# Patient Record
Sex: Male | Born: 1945 | Race: White | Hispanic: No | Marital: Married | Smoking: Former smoker
Health system: Southern US, Community
[De-identification: ages and names within clinical notes are randomized; demographics above are authoritative.]

## PROBLEM LIST (undated history)

## (undated) DIAGNOSIS — E7800 Pure hypercholesterolemia, unspecified: Secondary | ICD-10-CM

## (undated) DIAGNOSIS — F4310 Post-traumatic stress disorder, unspecified: Secondary | ICD-10-CM

## (undated) DIAGNOSIS — K219 Gastro-esophageal reflux disease without esophagitis: Secondary | ICD-10-CM

## (undated) DIAGNOSIS — N400 Enlarged prostate without lower urinary tract symptoms: Secondary | ICD-10-CM

## (undated) DIAGNOSIS — M5430 Sciatica, unspecified side: Secondary | ICD-10-CM

## (undated) DIAGNOSIS — M549 Dorsalgia, unspecified: Secondary | ICD-10-CM

## (undated) DIAGNOSIS — M4310 Spondylolisthesis, site unspecified: Secondary | ICD-10-CM

## (undated) DIAGNOSIS — R001 Bradycardia, unspecified: Secondary | ICD-10-CM

## (undated) DIAGNOSIS — I639 Cerebral infarction, unspecified: Secondary | ICD-10-CM

## (undated) HISTORY — DX: Pure hypercholesterolemia, unspecified: E78.00

## (undated) HISTORY — DX: Cerebral infarction, unspecified: I63.9

## (undated) HISTORY — DX: Spondylolisthesis, site unspecified: M43.10

## (undated) HISTORY — PX: NO PAST SURGERIES: SHX2092

---

## 2012-01-01 ENCOUNTER — Encounter (HOSPITAL_BASED_OUTPATIENT_CLINIC_OR_DEPARTMENT_OTHER): Payer: Self-pay | Admitting: Family Medicine

## 2012-01-01 ENCOUNTER — Emergency Department (HOSPITAL_BASED_OUTPATIENT_CLINIC_OR_DEPARTMENT_OTHER): Payer: Medicare Other

## 2012-01-01 ENCOUNTER — Emergency Department (HOSPITAL_BASED_OUTPATIENT_CLINIC_OR_DEPARTMENT_OTHER)
Admission: EM | Admit: 2012-01-01 | Discharge: 2012-01-01 | Disposition: A | Discharge status: Home / Self Care | Payer: Medicare Other | Attending: Emergency Medicine | Admitting: Emergency Medicine

## 2012-01-01 DIAGNOSIS — M7072 Other bursitis of hip, left hip: Secondary | ICD-10-CM

## 2012-01-01 DIAGNOSIS — M76899 Other specified enthesopathies of unspecified lower limb, excluding foot: Secondary | ICD-10-CM | POA: Insufficient documentation

## 2012-01-01 HISTORY — DX: Gastro-esophageal reflux disease without esophagitis: K21.9

## 2012-01-01 MED ORDER — OXYCODONE-ACETAMINOPHEN 5-325 MG PO TABS: 2.0000 | ORAL_TABLET | ORAL | Status: DC | PRN

## 2012-01-01 MED ORDER — TRAMADOL HCL 50 MG PO TABS: 50.0000 mg | ORAL_TABLET | Freq: Four times a day (QID) | ORAL | Status: DC | PRN

## 2012-01-01 NOTE — ED Provider Notes (Signed)
History     CSN: [id number]  Arrival date & time 01/01/12  1020   First MD Initiated Contact with Patient 01/01/12 1215      Chief Complaint  Patient presents with  . Hip Pain    (Consider location/radiation/quality/duration/timing/severity/associated sxs/prior treatment) HPI Comments: Patient is a 66 year old male who presents with left hip pain for the past week. The pain started gradually and is now a constant aching pain. The pain starts in his left hip and radiates to left knee. He reports having this pain in the past for which he received a cortisone injection. He reports having a negative xray 2 days ago at his doctor's office. Patient reports the pain affecting his ability to walk today. Some of the pain has been relieved with hydrocodone at home. Weight bearing activity and left leg movement make the pain worse. Nothing makes the pain better. Patient denies recent injury, NVD fever, numbness/tingling, weakness/coolness/color change of extremity, abdominal pain, chest pain, SOB.    Past Medical History  Diagnosis Date  . GERD (gastroesophageal reflux disease)     History reviewed. No pertinent past surgical history.  No family history on file.  History  Substance Use Topics  . Smoking status: Never Smoker   . Smokeless tobacco: Not on file  . Alcohol Use: No      Review of Systems  Musculoskeletal: Positive for arthralgias.  All other systems reviewed and are negative.    Allergies  Review of patient's allergies indicates no known allergies.  Home Medications  No current outpatient prescriptions on file.  BP 148/80  Pulse 55  Temp 97.7 F (36.5 C) (Oral)  Resp 16  Ht 5\' 10"  (1.778 m)  Wt 196 lb (88.905 kg)  BMI 28.12 kg/m2  SpO2 100%  Physical Exam  Nursing note and vitals reviewed. Constitutional: He is oriented to person, place, and time. He appears well-developed and well-nourished. No distress.  HENT:  Head: Normocephalic and atraumatic.    Eyes: Conjunctivae normal and EOM are normal.  Neck: Normal range of motion. Neck supple.  Cardiovascular: Normal rate and regular rhythm.  Exam reveals no gallop and no friction rub.   No murmur heard. Pulmonary/Chest: Effort normal and breath sounds normal. He has no wheezes. He has no rales. He exhibits no tenderness.  Abdominal: Soft. There is no tenderness.  Musculoskeletal: Normal range of motion.       Tenderness to palpation of posterior and lateral left hip. No edema noted. ROM of left hip slightly limited due to pain. Full ROM of right hip. Left knee has full ROM as well as right knee. No edema noted of knees.   Neurological: He is alert and oriented to person, place, and time. Coordination normal.       Sensation equal bilaterally. Strength diminished of left lower extremity due to pain with hip movement. Speech is goal-oriented. Moves limbs without ataxia.   Skin: Skin is warm and dry. He is not diaphoretic.       No bruising or color change noted.   Psychiatric: He has a normal mood and affect. His behavior is normal.    ED Course  Procedures (including critical care time)  Labs Reviewed - No data to display Dg Hip Complete Left  01/01/2012  *RADIOLOGY REPORT*  Clinical Data: Left hip pain  LEFT HIP - COMPLETE 2+ VIEW  Comparison: None.  Findings: Frontal pelvis shows no fracture.  SI joints and symphysis pubis are normal.  Joint space in  the hips is well preserved and symmetric.  AP and frog-leg lateral views of the left upper normal.  IMPRESSION: No acute bony findings.   Original Report Authenticated By: ERIC A. MANSELL, M.D.      1. Bursitis of left hip       MDM  12:35 PM Patient will have left hip xray to evaluate for degenerative changes and rule out fracture. Patient likely will be referred to orthopedics. He denies needing pain medication at this time.   2:00 PM Hip xray negative for fracture or degenerative changes. Patient likely has hip bursitis. Patient  will follow up with Orthopedic surgeon for further evaluation as needed. Prescribed Ultram and Percocet to be used for pain control separately. Patient is agreeable to plan. No saddle paresthesias, bladder/bowel incontinence.       Emilia Beck, PA-C 01/11/12 1002

## 2012-01-01 NOTE — ED Notes (Signed)
Pt sts he thinks he injured left hip about a wk ago and since has been having pain to left hip radiating down left leg and knee. Pt sts he had negative XR 2 days ago. Pt has had injection to same hip for pain in recent past. Pt took 2 hydrocodone prior to arrival. Pt sts he couldn't walk prior to taking the pain med.

## 2012-01-01 NOTE — ED Provider Notes (Signed)
66 year old male injured his left hip 3 weeks ago. 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 He's been having pain in his hip since then. He saw his PCP who gave him a steroid injection which gave temporary relief. He got worse after [place] On exam, he has had tenderness to palpation in the left sacroiliac area and pain is reproduced by external rotation of the hip. There's negative straight leg raise. His x-rays were reviewed by myself and appeared completely normal. He appears to have a bursitis of his hip and will be treated symptomatically. He is referred back to his PCP to consider MRI scanning if he does not show any improvement.  Medical screening examination/treatment/procedure(s) were conducted as a shared visit with non-physician practitioner(s) and myself.  I personally evaluated the patient during the encounter   Dione Booze, MD 01/01/12 1353

## 2018-03-25 ENCOUNTER — Encounter (HOSPITAL_BASED_OUTPATIENT_CLINIC_OR_DEPARTMENT_OTHER): Payer: Self-pay

## 2018-03-25 ENCOUNTER — Observation Stay (HOSPITAL_BASED_OUTPATIENT_CLINIC_OR_DEPARTMENT_OTHER)
Admission: EM | Admit: 2018-03-25 | Discharge: 2018-03-26 | Disposition: A | Discharge status: Home / Self Care | Payer: Medicare Other | Attending: Internal Medicine | Admitting: Internal Medicine

## 2018-03-25 ENCOUNTER — Other Ambulatory Visit: Payer: Self-pay

## 2018-03-25 ENCOUNTER — Emergency Department (HOSPITAL_BASED_OUTPATIENT_CLINIC_OR_DEPARTMENT_OTHER): Payer: Medicare Other

## 2018-03-25 DIAGNOSIS — I639 Cerebral infarction, unspecified: Secondary | ICD-10-CM | POA: Diagnosis not present

## 2018-03-25 DIAGNOSIS — R471 Dysarthria and anarthria: Secondary | ICD-10-CM | POA: Insufficient documentation

## 2018-03-25 DIAGNOSIS — R2681 Unsteadiness on feet: Secondary | ICD-10-CM | POA: Diagnosis not present

## 2018-03-25 DIAGNOSIS — B9689 Other specified bacterial agents as the cause of diseases classified elsewhere: Secondary | ICD-10-CM | POA: Diagnosis not present

## 2018-03-25 DIAGNOSIS — R9431 Abnormal electrocardiogram [ECG] [EKG]: Secondary | ICD-10-CM | POA: Diagnosis not present

## 2018-03-25 DIAGNOSIS — I340 Nonrheumatic mitral (valve) insufficiency: Secondary | ICD-10-CM | POA: Diagnosis not present

## 2018-03-25 DIAGNOSIS — R2689 Other abnormalities of gait and mobility: Secondary | ICD-10-CM | POA: Diagnosis not present

## 2018-03-25 DIAGNOSIS — Z87891 Personal history of nicotine dependence: Secondary | ICD-10-CM | POA: Insufficient documentation

## 2018-03-25 DIAGNOSIS — K219 Gastro-esophageal reflux disease without esophagitis: Secondary | ICD-10-CM | POA: Diagnosis present

## 2018-03-25 DIAGNOSIS — Z79899 Other long term (current) drug therapy: Secondary | ICD-10-CM | POA: Insufficient documentation

## 2018-03-25 DIAGNOSIS — Z8249 Family history of ischemic heart disease and other diseases of the circulatory system: Secondary | ICD-10-CM | POA: Diagnosis not present

## 2018-03-25 DIAGNOSIS — G459 Transient cerebral ischemic attack, unspecified: Secondary | ICD-10-CM | POA: Diagnosis present

## 2018-03-25 DIAGNOSIS — M5440 Lumbago with sciatica, unspecified side: Secondary | ICD-10-CM | POA: Insufficient documentation

## 2018-03-25 DIAGNOSIS — M549 Dorsalgia, unspecified: Secondary | ICD-10-CM | POA: Diagnosis present

## 2018-03-25 DIAGNOSIS — J320 Chronic maxillary sinusitis: Secondary | ICD-10-CM | POA: Diagnosis not present

## 2018-03-25 DIAGNOSIS — I517 Cardiomegaly: Secondary | ICD-10-CM | POA: Insufficient documentation

## 2018-03-25 DIAGNOSIS — E785 Hyperlipidemia, unspecified: Secondary | ICD-10-CM | POA: Insufficient documentation

## 2018-03-25 HISTORY — DX: Dorsalgia, unspecified: M54.9

## 2018-03-25 HISTORY — DX: Sciatica, unspecified side: M54.30

## 2018-03-25 LAB — RAPID URINE DRUG SCREEN, HOSP PERFORMED
Amphetamines: NOT DETECTED
BARBITURATES: NOT DETECTED
BENZODIAZEPINES: NOT DETECTED
COCAINE: NOT DETECTED
Opiates: NOT DETECTED
Tetrahydrocannabinol: NOT DETECTED

## 2018-03-25 LAB — CBC
HCT: 44.6 % (ref 39.0–52.0)
Hemoglobin: 14.7 g/dL (ref 13.0–17.0)
MCH: 29.6 pg (ref 26.0–34.0)
MCHC: 33 g/dL (ref 30.0–36.0)
MCV: 89.9 fL (ref 80.0–100.0)
NRBC: 0 % (ref 0.0–0.2)
PLATELETS: 162 10*3/uL (ref 150–400)
RBC: 4.96 MIL/uL (ref 4.22–5.81)
RDW: 11.9 % (ref 11.5–15.5)
WBC: 8.8 10*3/uL (ref 4.0–10.5)

## 2018-03-25 LAB — COMPREHENSIVE METABOLIC PANEL
ALBUMIN: 4.4 g/dL (ref 3.5–5.0)
ALK PHOS: 91 U/L (ref 38–126)
ALT: 18 U/L (ref 0–44)
ANION GAP: 7 (ref 5–15)
AST: 24 U/L (ref 15–41)
BILIRUBIN TOTAL: 0.7 mg/dL (ref 0.3–1.2)
BUN: 13 mg/dL (ref 8–23)
CALCIUM: 9.5 mg/dL (ref 8.9–10.3)
CO2: 27 mmol/L (ref 22–32)
Chloride: 103 mmol/L (ref 98–111)
Creatinine, Ser: 0.88 mg/dL (ref 0.61–1.24)
GFR calc Af Amer: >60 mL/min (ref >60)
GFR calc non Af Amer: >60 mL/min (ref >60)
GLUCOSE: 95 mg/dL (ref 70–99)
Potassium: 3.8 mmol/L (ref 3.5–5.1)
Sodium: 137 mmol/L (ref 135–145)
TOTAL PROTEIN: 7 g/dL (ref 6.5–8.1)

## 2018-03-25 LAB — URINALYSIS, ROUTINE W REFLEX MICROSCOPIC
Bilirubin Urine: NEGATIVE
GLUCOSE, UA: NEGATIVE mg/dL
Hgb urine dipstick: NEGATIVE
Ketones, ur: NEGATIVE mg/dL
LEUKOCYTES UA: NEGATIVE
Nitrite: NEGATIVE
PH: 6.5 (ref 5.0–8.0)
Protein, ur: NEGATIVE mg/dL
Specific Gravity, Urine: <1.005 — ABNORMAL LOW (ref 1.005–1.030)

## 2018-03-25 LAB — DIFFERENTIAL
Abs Immature Granulocytes: 0.03 10*3/uL (ref 0.00–0.07)
BASOS ABS: 0.1 10*3/uL (ref 0.0–0.1)
Basophils Relative: 1 %
EOS ABS: 0.4 10*3/uL (ref 0.0–0.5)
EOS PCT: 4 %
IMMATURE GRANULOCYTES: 0 %
LYMPHS PCT: 28 %
Lymphs Abs: 2.5 10*3/uL (ref 0.7–4.0)
MONO ABS: 0.8 10*3/uL (ref 0.1–1.0)
Monocytes Relative: 9 %
NEUTROS PCT: 58 %
Neutro Abs: 5.1 10*3/uL (ref 1.7–7.7)

## 2018-03-25 LAB — APTT: APTT: 31 s (ref 24–36)

## 2018-03-25 LAB — PROTIME-INR
INR: 0.95
PROTHROMBIN TIME: 12.6 s (ref 11.4–15.2)

## 2018-03-25 LAB — ETHANOL: Alcohol, Ethyl (B): <10 mg/dL (ref <10)

## 2018-03-25 LAB — CBG MONITORING, ED: Glucose-Capillary: 90 mg/dL (ref 70–99)

## 2018-03-25 LAB — TROPONIN I: Troponin I: <0.03 ng/mL (ref <0.03)

## 2018-03-25 NOTE — ED Provider Notes (Signed)
[facility] EMERGENCY DEPARTMENT Provider Note   CSN: [id number] Arrival date & time: 03/25/18  1944   History   Chief Complaint Chief Complaint  Patient presents with  . Aphasia    HPI Isaac Hicks is a 73 y.o. male with PMH of GERD and sciatica here after having slurring of speech earlier today.  Patient reports that he was on the phone with his son around 2 PM and around 45 called his grandson, who he thought was his son and found his tongue very heavy and unable to speak words.  His grandson reports that he was slurring his speech and he was unable to understand what he was saying.  Patient also reports that he had a sudden onset headache on his left frontal aspect of his head.  He also at the time felt as if he was unable to walk.  He states that it felt as if he was unsteady and would fall if he tried to walk.  Patient did not note any certain weakness in any one leg or extremity.  His grandson who was 5 minutes away immediately came to pick him up.  His grandson notes that around 77 patient symptoms have completely resolved.  Patient denies any history of stroke.  Reports that he is otherwise relatively healthy.  He is also very active.  Denies any recent illnesses, nausea, vomiting.   Upon our exam in the emergency room patient with no slurring of speech.  Also with no weakness.   HPI  Past Medical History:  Diagnosis Date  . GERD (gastroesophageal reflux disease)   . Sciatica     There are no active problems to display for this patient.   History reviewed. No pertinent surgical history.     Home Medications    Prior to Admission medications   Medication Sig Start Date End Date Taking? Authorizing Provider  oxyCODONE-acetaminophen (PERCOCET/ROXICET) 5-325 MG per tablet Take 2 tablets by mouth every 4 (four) hours as needed for pain. 01/01/12   Szekalski, Kaitlyn, PA-C  traMADol (ULTRAM) 50 MG tablet Take 1 tablet (50 mg total) by mouth every 6 (six)  hours as needed for pain. 01/01/12   Emilia Beck, PA-C    Family History No family history on file.  Social History Social History   Tobacco Use  . Smoking status: Former Games developer  . Smokeless tobacco: Never Used  Substance Use Topics  . Alcohol use: No  . Drug use: No     Allergies   Patient has no known allergies.   Review of Systems Review of Systems  Constitutional: Negative for chills and fever.  Eyes: Negative for visual disturbance.  Respiratory: Negative for shortness of breath.   Cardiovascular: Negative for chest pain.  Gastrointestinal: Negative for nausea and vomiting.  Neurological: Positive for speech difficulty, weakness and headaches. Negative for light-headedness.  All other systems reviewed and are negative.    Physical Exam Updated Vital Signs BP (!) 142/92   Pulse 74   Temp 98.3 F (36.8 C) (Oral)   Resp (!) 21   Ht 5\' 9"  (1.753 m)   Wt 78 kg   SpO2 98%   BMI 25.40 kg/m   Physical Exam Vitals signs and nursing note reviewed.  Constitutional:      General: He is not in acute distress.    Appearance: Normal appearance.  HENT:     Head: Normocephalic and atraumatic.     Nose: Nose normal.  Eyes:     Extraocular  Movements: Extraocular movements intact.     Conjunctiva/sclera: Conjunctivae normal.     Pupils: Pupils are equal, round, and reactive to light.  Neck:     Musculoskeletal: Normal range of motion and neck supple.  Pulmonary:     Effort: Pulmonary effort is normal.  Musculoskeletal: Normal range of motion.  Skin:    General: Skin is warm.  Neurological:     General: No focal deficit present.     Mental Status: He is alert and oriented to person, place, and time. Mental status is at baseline.     Cranial Nerves: No cranial nerve deficit.     Motor: No weakness.     Coordination: Coordination normal.  Psychiatric:        Mood and Affect: Mood normal.        Behavior: Behavior normal.        Thought Content: Thought  content normal.      ED Treatments / Results  Labs (all labs ordered are listed, but only abnormal results are displayed) Labs Reviewed  URINALYSIS, ROUTINE W REFLEX MICROSCOPIC - Abnormal; Notable for the following components:      Result Value   Specific Gravity, Urine <1.005 (*)    All other components within normal limits  ETHANOL  PROTIME-INR  APTT  CBC  DIFFERENTIAL  COMPREHENSIVE METABOLIC PANEL  TROPONIN I  RAPID URINE DRUG SCREEN, HOSP PERFORMED  CBG MONITORING, ED    EKG EKG Interpretation  Date/Time:  Wednesday March 25 2018 19:55:50 EST Ventricular Rate:  67 PR Interval:    QRS Duration: 97 QT Interval:  418 QTC Calculation: 442 R Axis:   -39 Text Interpretation:  Sinus rhythm Left axis deviation Abnormal R-wave progression, late transition no prior available for comparison Confirmed by Tilden Fossaees, Elizabeth [phone]([id number]) on 03/25/2018 7:59:54 PM   Radiology Ct Head Wo Contrast  Result Date: 03/25/2018 CLINICAL DATA:  Dysarthria and gait difficulties, now back to baseline. No reported injury. EXAM: CT HEAD WITHOUT CONTRAST TECHNIQUE: Contiguous axial images were obtained from the base of the skull through the vertex without intravenous contrast. COMPARISON:  None. FINDINGS: Brain: No evidence of parenchymal hemorrhage or extra-axial fluid collection. No mass lesion, mass effect, or midline shift. No CT evidence of acute infarction. Nonspecific mild subcortical and periventricular white matter hypodensity, most in keeping with chronic small vessel ischemic change. Cerebral volume is age appropriate. No ventriculomegaly. Vascular: No acute abnormality. Skull: No evidence of calvarial fracture. Sinuses/Orbits: No fluid levels. Mucoperiosteal thickening throughout the left maxillary sinus. Other:  The mastoid air cells are unopacified. IMPRESSION: 1. No evidence of acute intracranial abnormality. 2. 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 3. Chronic  appearing left maxillary sinusitis. Electronically Signed   By: Delbert PhenixJason A Poff M.D.   On: 03/25/2018 20:20    Procedures Procedures (including critical care time)  Medications Ordered in ED Medications - No data to display   Initial Impression / Assessment and Plan / ED Course  I have reviewed the triage vital signs and the nursing notes.  Pertinent labs & imaging results that were available during my care of the patient were reviewed by me and considered in my medical decision making (see chart for details).    73 year old male who presents with symptoms of TIA.  Patient notes that he was having trouble with slurring of speech.  He was also having trouble with his gait due to feeling unsteady.  Will perform stroke work-up with CT head without, UA,  CMP, CBC, troponin, ethanol level, UDS.  Dr. Madilyn Hook spoke with neurology over the phone regarding need for other work-up and states that as patient symptoms have resolved we can do traditional stroke work-up.  Patient's lab work grossly normal with CT head without not showing any acute bleed.  Patient to be admitted to hospitalist at [facility] for TIA work-up.   [place] Tabria Steines, DO PGY-2, [facility]   Final Clinical Impressions(s) / ED Diagnoses   Final diagnoses:  TIA (transient ischemic attack)    ED Discharge Orders    None       Zasha Belleau, [place], DO 03/25/18 2120    Tilden Fossa, MD 03/26/18 0003

## 2018-03-25 NOTE — ED Notes (Signed)
Pt currently in CT.

## 2018-03-25 NOTE — ED Notes (Signed)
Pt returned from CT, on cardiac monitor and family at the bedside

## 2018-03-25 NOTE — ED Notes (Signed)
Pt states that during the episode of slurred speech, he had a headache, and now the headache has subsided

## 2018-03-25 NOTE — ED Triage Notes (Signed)
Pt states he was on the phone with grandson ~705pm when his speech slurred and he felt he could not walk-son went to his home and brought him in-also c/o HA-pt was able to stand from chair to w/c to stretcher-EDP in room upon arrival

## 2018-03-25 NOTE — ED Notes (Signed)
Phoned pt's wife and updated her to pt's admission status

## 2018-03-26 ENCOUNTER — Observation Stay (HOSPITAL_BASED_OUTPATIENT_CLINIC_OR_DEPARTMENT_OTHER): Payer: Medicare Other

## 2018-03-26 ENCOUNTER — Observation Stay (HOSPITAL_COMMUNITY): Payer: Medicare Other

## 2018-03-26 ENCOUNTER — Encounter (HOSPITAL_COMMUNITY): Payer: Self-pay | Admitting: Internal Medicine

## 2018-03-26 DIAGNOSIS — M549 Dorsalgia, unspecified: Secondary | ICD-10-CM | POA: Diagnosis present

## 2018-03-26 DIAGNOSIS — I639 Cerebral infarction, unspecified: Secondary | ICD-10-CM | POA: Diagnosis not present

## 2018-03-26 DIAGNOSIS — K219 Gastro-esophageal reflux disease without esophagitis: Secondary | ICD-10-CM | POA: Diagnosis not present

## 2018-03-26 DIAGNOSIS — E785 Hyperlipidemia, unspecified: Secondary | ICD-10-CM | POA: Diagnosis not present

## 2018-03-26 DIAGNOSIS — I340 Nonrheumatic mitral (valve) insufficiency: Secondary | ICD-10-CM

## 2018-03-26 DIAGNOSIS — G459 Transient cerebral ischemic attack, unspecified: Secondary | ICD-10-CM

## 2018-03-26 DIAGNOSIS — I370 Nonrheumatic pulmonary valve stenosis: Secondary | ICD-10-CM

## 2018-03-26 DIAGNOSIS — I361 Nonrheumatic tricuspid (valve) insufficiency: Secondary | ICD-10-CM

## 2018-03-26 LAB — LIPID PANEL
Cholesterol: 189 mg/dL (ref 0–200)
HDL: 37 mg/dL — ABNORMAL LOW (ref >40)
LDL Cholesterol: 132 mg/dL — ABNORMAL HIGH (ref 0–99)
TRIGLYCERIDES: 102 mg/dL (ref <150)
Total CHOL/HDL Ratio: 5.1 RATIO
VLDL: 20 mg/dL (ref 0–40)

## 2018-03-26 LAB — HEMOGLOBIN A1C
Hgb A1c MFr Bld: 5.2 % (ref 4.8–5.6)
Mean Plasma Glucose: 102.54 mg/dL

## 2018-03-26 LAB — ECHOCARDIOGRAM COMPLETE
Height: 69 in
Weight: 2752 oz

## 2018-03-26 MED ORDER — ONDANSETRON HCL 4 MG/2ML IJ SOLN: 4.0000 mg | Freq: Three times a day (TID) | INTRAMUSCULAR | Status: DC | PRN

## 2018-03-26 MED ORDER — GABAPENTIN 100 MG PO CAPS: 100.0000 mg | ORAL_CAPSULE | Freq: Two times a day (BID) | ORAL | Status: DC | PRN

## 2018-03-26 MED ORDER — STROKE: EARLY STAGES OF RECOVERY BOOK
Freq: Once | Status: DC
  Filled 2018-03-26: qty 1

## 2018-03-26 MED ORDER — PANTOPRAZOLE SODIUM 40 MG PO TBEC: 40.0000 mg | DELAYED_RELEASE_TABLET | Freq: Every day | ORAL | Status: DC | PRN

## 2018-03-26 MED ORDER — ASPIRIN EC 81 MG PO TBEC
81.0000 mg | DELAYED_RELEASE_TABLET | Freq: Every day | ORAL | Status: DC
  Administered 2018-03-26: 81 mg via ORAL
  Filled 2018-03-26: qty 1

## 2018-03-26 MED ORDER — ACETAMINOPHEN 325 MG PO TABS: 650.0000 mg | ORAL_TABLET | ORAL | Status: DC | PRN

## 2018-03-26 MED ORDER — CLOPIDOGREL BISULFATE 75 MG PO TABS
75.0000 mg | ORAL_TABLET | Freq: Every day | ORAL | Status: DC
  Administered 2018-03-26: 75 mg via ORAL
  Filled 2018-03-26: qty 1

## 2018-03-26 MED ORDER — ACETAMINOPHEN 160 MG/5ML PO SOLN: 650.0000 mg | ORAL | Status: DC | PRN

## 2018-03-26 MED ORDER — ATORVASTATIN CALCIUM 40 MG PO TABS: 40.0000 mg | ORAL_TABLET | Freq: Every day | ORAL | 0 refills | Status: DC

## 2018-03-26 MED ORDER — CLOPIDOGREL BISULFATE 75 MG PO TABS: 75.0000 mg | ORAL_TABLET | Freq: Every day | ORAL | 0 refills | Status: DC

## 2018-03-26 MED ORDER — ASPIRIN 81 MG PO TBEC: 81.0000 mg | DELAYED_RELEASE_TABLET | Freq: Every day | ORAL | 0 refills | Status: DC

## 2018-03-26 MED ORDER — ENOXAPARIN SODIUM 40 MG/0.4ML ~~LOC~~ SOLN
40.0000 mg | SUBCUTANEOUS | Status: DC
  Administered 2018-03-26: 40 mg via SUBCUTANEOUS
  Filled 2018-03-26: qty 0.4

## 2018-03-26 MED ORDER — ASPIRIN 300 MG RE SUPP: 300.0000 mg | Freq: Every day | RECTAL | Status: DC

## 2018-03-26 MED ORDER — OXYCODONE-ACETAMINOPHEN 5-325 MG PO TABS: 2.0000 | ORAL_TABLET | ORAL | Status: DC | PRN

## 2018-03-26 MED ORDER — ATORVASTATIN CALCIUM 40 MG PO TABS
40.0000 mg | ORAL_TABLET | Freq: Every day | ORAL | Status: DC
  Administered 2018-03-26: 40 mg via ORAL
  Filled 2018-03-26: qty 1

## 2018-03-26 MED ORDER — ACETAMINOPHEN 650 MG RE SUPP: 650.0000 mg | RECTAL | Status: DC | PRN

## 2018-03-26 MED ORDER — ASPIRIN 325 MG PO TABS: 325.0000 mg | ORAL_TABLET | Freq: Every day | ORAL | Status: DC

## 2018-03-26 MED ORDER — SENNOSIDES-DOCUSATE SODIUM 8.6-50 MG PO TABS: 1.0000 | ORAL_TABLET | Freq: Every evening | ORAL | Status: DC | PRN

## 2018-03-26 MED ORDER — ZOLPIDEM TARTRATE 5 MG PO TABS: 5.0000 mg | ORAL_TABLET | Freq: Every evening | ORAL | Status: DC | PRN

## 2018-03-26 NOTE — Progress Notes (Signed)
VASCULAR LAB PRELIMINARY  PRELIMINARY  PRELIMINARY  PRELIMINARY  Carotid duplex completed.    Preliminary report:  1-39% ICA plaquing.  Vertebral artery flow is antegrade.   Neithan Day, RVT 03/26/2018, 8:30 AM

## 2018-03-26 NOTE — Evaluation (Signed)
Physical Therapy Evaluation Patient Details Name: Isaac CastleFredrick Hicks MRN: [id number] DOB: 24-Jan-1946 Today's Date: 03/26/2018   History of Present Illness  Pt is a 73 y.o. male with medical history significant of cardiac, chronic back pain, who presents with difficulty speaking, slurred speech, gait instability. 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    Clinical Impression  Pt presented sitting OOB in recliner chair, awake and willing to participate in therapy session. Pt's spouse and son present throughout as well. Prior to admission, pt reported that he was independent with all functional mobility and ADLs. Pt lives with his wife in a single level home with a level entry. He currently requires min guard overall for transfers and ambulation without use of an AD. Pt would benefit from f/u PT in the OP setting for higher level balance and strengthening. Pt would continue to benefit from skilled physical therapy services at this time while admitted and after d/c to address the below listed limitations in order to improve overall safety and independence with functional mobility.     Follow Up Recommendations Outpatient PT    Equipment Recommendations  None recommended by PT    Recommendations for Other Services       Precautions / Restrictions Precautions Precautions: Fall Restrictions Weight Bearing Restrictions: No      Mobility  Bed Mobility               General bed mobility comments: pt OOB in recliner chair upon arrival  Transfers Overall transfer level: Needs assistance Equipment used: None Transfers: Sit to/from Stand Sit to Stand: Min guard         General transfer comment: close min guard for safety  Ambulation/Gait Ambulation/Gait assistance: Min guard Gait Distance (Feet): 200 Feet Assistive device: None Gait Pattern/deviations: Step-through pattern;Drifts right/left Gait velocity: decreased   General Gait Details:  donned shoes prior to ambulation; pt with mild instability but no overt LOB or need for physical assistance, min guard for safety  Stairs            Wheelchair Mobility    Modified Rankin (Stroke Patients Only) Modified Rankin (Stroke Patients Only) Pre-Morbid Rankin Score: No symptoms Modified Rankin: Slight disability     Balance Overall balance assessment: Needs assistance Sitting-balance support: Feet supported Sitting balance-Leahy Scale: Good     Standing balance support: No upper extremity supported Standing balance-Leahy Scale: Fair                               Pertinent Vitals/Pain Pain Assessment: No/denies pain    Home Living Family/patient expects to be discharged to:: Private residence Living Arrangements: Spouse/significant other Available Help at Discharge: Family Type of Home: House Home Access: Level entry     Home Layout: One level Home Equipment: Gilmer Morane - single point      Prior Function Level of Independence: Independent               Hand Dominance        Extremity/Trunk Assessment   Upper Extremity Assessment Upper Extremity Assessment: Defer to OT evaluation;Overall WFL for tasks assessed    Lower Extremity Assessment Lower Extremity Assessment: LLE deficits/detail LLE Deficits / Details: hx of sciatica 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    Cervical / Trunk Assessment Cervical / Trunk Assessment: Kyphotic  Communication   Communication: No difficulties  Cognition Arousal/Alertness: Awake/alert Behavior During Therapy: WFL for tasks  assessed/performed Overall Cognitive Status: Within Functional Limits for tasks assessed                                        General Comments      Exercises     Assessment/Plan    PT Assessment Patient needs continued PT services  PT Problem List Decreased strength;Decreased balance;Decreased mobility;Decreased coordination;Decreased knowledge of  use of DME;Decreased safety awareness;Decreased knowledge of precautions       PT Treatment Interventions DME instruction;Gait training;Stair training;Functional mobility training;Therapeutic activities;Therapeutic exercise;Balance training;Neuromuscular re-education;Patient/family education    PT Goals (Current goals can be found in the Care Plan section)  Acute Rehab PT Goals Patient Stated Goal: return home today PT Goal Formulation: With patient/family Time For Goal Achievement: 04/09/18 Potential to Achieve Goals: Good    Frequency Min 4X/week   Barriers to discharge        Co-evaluation               AM-PAC PT "6 Clicks" Mobility  Outcome Measure Help needed turning from your back to your side while in a flat bed without using bedrails?: None Help needed moving from lying on your back to sitting on the side of a flat bed without using bedrails?: None Help needed moving to and from a bed to a chair (including a wheelchair)?: None Help needed standing up from a chair using your arms (e.g., wheelchair or bedside chair)?: None Help needed to walk in hospital room?: None Help needed climbing 3-5 steps with a railing? : A Little 6 Click Score: 23    End of Session Equipment Utilized During Treatment: Gait belt Activity Tolerance: Patient tolerated treatment well Patient left: in chair;with call bell/phone within reach;with family/visitor present Nurse Communication: Mobility status PT Visit Diagnosis: Other abnormalities of gait and mobility (R26.89)    Time: 2536-6440 PT Time Calculation (min) (ACUTE ONLY): 19 min   Charges:   PT Evaluation $PT Eval Moderate Complexity: 1 Mod          Deborah Chalk, PT, DPT  Acute Rehabilitation Services Pager [phone] Office [phone]    Alessandra Bevels Nicholas Ossa 03/26/2018, 2:14 PM

## 2018-03-26 NOTE — Consult Note (Addendum)
Stroke Neurology Consultation Note  Consult Requested by: Dr. Jerral Ralph  Reason for Consult: stroke  Consult Date: 03/26/18  The history was obtained from the pt and wife.  During history and examination, all items were able to obtain unless otherwise noted.  History of Present Illness:  Isaac Hicks is a 73 y.o. Caucasian male with PMH of LBP and sciatica, HLD admitted for transient slurred speech and imbalance.  Pt was talking to his grandson over the phone last night at 7pm and was found to have slurry speech and not able to be understood. When he got up and he felt right leg lack of control and imbalanced on walking. The episode lasted about 5 min and resolved. Shortly after he had headache at right frontal and lasted less than one hour and resolved also. He was sent to ER for evaluation. CT brain negative. Neurology was consulted.   Patient denies any significant past medical history.  He had lower back pain and sciatica, denies any high blood pressure, diabetes.  But seems he does have hyperlipidemia but not on any medication.  LSN: 7pm 03/25/18 tPA Given: No: symptoms resolved  Past Medical History:  Diagnosis Date  . Back pain   . GERD (gastroesophageal reflux disease)   . Sciatica     History reviewed. No pertinent surgical history.  Family History  Problem Relation Age of Onset  . Heart disease Brother        CAD    Social History:  reports that he has quit smoking. He has never used smokeless tobacco. He reports that he does not drink alcohol or use drugs.  Allergies: No Known Allergies  No current facility-administered medications on file prior to encounter.    Current Outpatient Medications on File Prior to Encounter  Medication Sig Dispense Refill  . gabapentin (NEURONTIN) 100 MG capsule Take 100 mg by mouth 2 (two) times daily.    . Multiple Vitamin (MULTIVITAMIN WITH MINERALS) TABS tablet Take 1 tablet by mouth daily.    . saw palmetto 160 MG capsule Take  160 mg by mouth 2 (two) times daily.      Review of Systems: A full ROS was attempted today and was able to be performed.  Systems assessed include - Constitutional, Eyes, HENT, Respiratory, Cardiovascular, Gastrointestinal, Genitourinary, Integument/breast, Hematologic/lymphatic, Musculoskeletal, Neurological, Behavioral/Psych, Endocrine, Allergic/Immunologic - with pertinent responses as per HPI.  Physical Examination: Temp:  [97.8 F (36.6 C)-98.3 F (36.8 C)] 97.8 F (36.6 C) (01/09 0619) Pulse Rate:  [59-91] 64 (01/09 0956) Resp:  [13-22] 18 (01/09 0619) BP: (101-151)/(70-94) 139/83 (01/09 0956) SpO2:  [95 %-100 %] 100 % (01/09 0956) Weight:  [78 kg] 78 kg (01/08 1955)  General - well nourished, well developed, in no apparent distress.    Ophthalmologic - fundi not visualized due to noncooperation.    Cardiovascular - regular rate and rhythm  Mental Status -  Level of arousal and orientation to time, place, and person were intact. Language including expression, naming, repetition, comprehension, reading, and writing was assessed and found intact. Fund of Knowledge was assessed and was intact.  Cranial Nerves II - XII - II - Vision intact OU. III, IV, VI - Extraocular movements intact. V - Facial sensation intact bilaterally. VII - Facial movement intact bilaterally. VIII - Hearing & vestibular intact bilaterally. X - Palate elevates symmetrically. XI - Chin turning & shoulder shrug intact bilaterally. XII - Tongue protrusion intact.  Motor Strength - The patient's strength was normal in all extremities  and pronator drift was absent.   Motor Tone & Bulk - Muscle tone was assessed at the neck and appendages and was normal.  Bulk was normal and fasciculations were absent.   Reflexes - The patient's reflexes were normal in all extremities and he had no pathological reflexes.  Sensory - Light touch, temperature/pinprick were assessed and were normal.    Coordination - The  patient had normal movements in the hands and feet with no ataxia or dysmetria.  Tremor was absent.  Gait and Station - deferred  Data Reviewed: Ct Head Wo Contrast  Result Date: 03/25/2018 CLINICAL DATA:  Dysarthria and gait difficulties, now back to baseline. No reported injury. EXAM: CT HEAD WITHOUT CONTRAST TECHNIQUE: Contiguous axial images were obtained from the base of the skull through the vertex without intravenous contrast. COMPARISON:  None. FINDINGS: Brain: No evidence of parenchymal hemorrhage or extra-axial fluid collection. No mass lesion, mass effect, or midline shift. No CT evidence of acute infarction. Nonspecific mild subcortical and periventricular white matter hypodensity, most in keeping with chronic small vessel ischemic change. Cerebral volume is age appropriate. No ventriculomegaly. Vascular: No acute abnormality. Skull: No evidence of calvarial fracture. Sinuses/Orbits: No fluid levels. Mucoperiosteal thickening throughout the left maxillary sinus. Other:  The mastoid air cells are unopacified. IMPRESSION: 1. No evidence of acute intracranial abnormality. 2. 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 3. Chronic appearing left maxillary sinusitis. Electronically Signed   By: Delbert PhenixJason A Poff M.D.   On: 03/25/2018 20:20   Mr Brain Wo Contrast  Result Date: 03/26/2018 CLINICAL DATA:  Difficulty speaking with slurred speech and gait instability. EXAM: MRI HEAD WITHOUT CONTRAST MRA HEAD WITHOUT CONTRAST TECHNIQUE: Multiplanar, multiecho pulse sequences of the brain and surrounding structures were obtained without intravenous contrast. Angiographic images of the head were obtained using MRA technique without contrast. COMPARISON:  None. FINDINGS: MRI HEAD FINDINGS Brain: Small acute infarction along the upper right cerebellum. Mild chronic small vessel ischemic type change in the cerebral white matter. No hemorrhage, hydrocephalus, collection, or masslike  finding. Brain volume is normal. Vascular: Arterial findings below. Skull and upper cervical spine: Negative for marrow lesion. C3-4 disc degeneration and ridging. Sinuses/Orbits: Mild mucosal thickening in the left maxillary sinus. MRA HEAD FINDINGS Motion artifact at the level of the A1 and right M1 segments. Vessels, including the right superior cerebellar artery, are smooth and diffusely patent. No visible aneurysm. IMPRESSION: 1. 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 2. Negative intracranial MRA. Electronically Signed   By: Marnee SpringJonathon  Watts M.D.   On: 03/26/2018 07:31   Mr Shirlee LatchMra Head WGWo Contrast  Result Date: 03/26/2018 CLINICAL DATA:  Difficulty speaking with slurred speech and gait instability. EXAM: MRI HEAD WITHOUT CONTRAST MRA HEAD WITHOUT CONTRAST TECHNIQUE: Multiplanar, multiecho pulse sequences of the brain and surrounding structures were obtained without intravenous contrast. Angiographic images of the head were obtained using MRA technique without contrast. COMPARISON:  None. FINDINGS: MRI HEAD FINDINGS Brain: Small acute infarction along the upper right cerebellum. Mild chronic small vessel ischemic type change in the cerebral white matter. No hemorrhage, hydrocephalus, collection, or masslike finding. Brain volume is normal. Vascular: Arterial findings below. Skull and upper cervical spine: Negative for marrow lesion. C3-4 disc degeneration and ridging. Sinuses/Orbits: Mild mucosal thickening in the left maxillary sinus. MRA HEAD FINDINGS Motion artifact at the level of the A1 and right M1 segments. Vessels, including the right superior cerebellar artery, are smooth and diffusely patent. No visible aneurysm. IMPRESSION:  1. 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 2. Negative intracranial MRA. Electronically Signed   By: Marnee Spring M.D.   On: 03/26/2018 07:31   Vas US Carotid (at [facility])  Result Date: 03/26/2018 Carotid Arterial Duplex Study Indications:        CVA, Speech disturbance and Gait difficulties. Comparison Study:  No prior study on file for comparison Performing Technologist: Sherren Kerns RVS  Examination Guidelines: A complete evaluation includes B-mode imaging, spectral Doppler, color Doppler, and power Doppler as needed of all accessible portions of each vessel. Bilateral testing is considered an integral part of a complete examination. Limited examinations for reoccurring indications may be performed as noted.  Right Carotid Findings: +----------+--------+--------+--------+--------+------------------+           PSV cm/sEDV cm/sStenosisDescribeComments           +----------+--------+--------+--------+--------+------------------+ CCA Prox  89      18                      intimal thickening +----------+--------+--------+--------+--------+------------------+ CCA Distal111     30                      intimal thickening +----------+--------+--------+--------+--------+------------------+ ICA Prox  65      21                                         +----------+--------+--------+--------+--------+------------------+ ICA Distal64      27                                         +----------+--------+--------+--------+--------+------------------+ ECA       91      14                                         +----------+--------+--------+--------+--------+------------------+ +----------+--------+-------+--------+-------------------+           PSV cm/sEDV cmsDescribeArm Pressure (mmHG) +----------+--------+-------+--------+-------------------+ [id number]                                         +----------+--------+-------+--------+-------------------+ +---------+--------+--+--------+-+ VertebralPSV cm/s35EDV cm/s9 +---------+--------+--+--------+-+  Left Carotid Findings: +----------+--------+--------+--------+--------+------------------+           PSV cm/sEDV cm/sStenosisDescribeComments            +----------+--------+--------+--------+--------+------------------+ CCA Prox                                  intimal thickening +----------+--------+--------+--------+--------+------------------+ CCA Distal110     23                      intimal thickening +----------+--------+--------+--------+--------+------------------+ ICA Prox  76      28                                         +----------+--------+--------+--------+--------+------------------+ ICA Distal86      32                                         +----------+--------+--------+--------+--------+------------------+  ECA       100     18                                         +----------+--------+--------+--------+--------+------------------+ +----------+--------+--------+--------+-------------------+ SubclavianPSV cm/sEDV cm/sDescribeArm Pressure (mmHG) +----------+--------+--------+--------+-------------------+           85                                          +----------+--------+--------+--------+-------------------+ +---------+--------+--+--------+--+ VertebralPSV cm/s45EDV cm/s15 +---------+--------+--+--------+--+  Summary: Right Carotid: The extracranial vessels were near-normal with only minimal wall                thickening or plaque. Left Carotid: The extracranial vessels were near-normal with only minimal wall               thickening or plaque. Vertebrals:  Bilateral vertebral arteries demonstrate antegrade flow. Subclavians: Normal flow hemodynamics were seen in bilateral subclavian              arteries. *See table(s) above for measurements and observations.     Preliminary    TTE - Left ventricle: The cavity size was normal. There was mild focal   basal hypertrophy of the septum. Systolic function was normal.   The estimated ejection fraction was in the range of 60% to 65%.   Wall motion was normal; there were no regional wall motion   abnormalities. Left ventricular  diastolic function parameters   were normal. - Mitral valve: There was mild regurgitation. Valve area by   pressure half-time: 2.29 cm^2. - Atrial septum: No defect or patent foramen ovale was identified. - Impressions: Retro cardiac structure ? aorta vs esophagus can   consider CT chest to further evaluate. Impressions: - Retro cardiac structure ? aorta vs esophagus can consider CT   chest to further evaluate.  Assessment: 73 y.o. male with PMH of LBP and sciatica, HLD admitted for transient slurred speech and imbalance. Now symptoms resolved. CT negative but MRI showed right SCA small infarcts. MRA, CUS and EF unremarkable. LDL 132 and A1C 5.2. Pt no significant risk factor except LDL 132. The stroke location can be small vessel disease vs. Embolic pattern. Discussed with pt and family about 30 day monitoring and TEE/loop, they prefer 30 day cardiac event monitoring to rule out afib at this time. Will recommend DAPT with ASA 81 and plavix 75 for 3 weeks and then ASA alone. Continue lipitor 40mg  on discharge. Follow up with neurology as outpt.   Stroke Risk Factors - hyperlipidemia  Plan: - recommend DAPT with ASA 81 and plavix 75 for 3 weeks and then ASA alone. - Continue lipitor 40mg  on discharge.  - Follow up with neurology as outpt as scheduled.  - 30 day cardiac event monitoring as outpt to rule out afib. May consider TEE and loop if 30 day monitoring negative.  - PT/OT recommend outpt PT/OT - OK to d/c from neuro standpoint. - I had long discussion with pt, wife and son at bedside, updated pt current condition, treatment plan and potential prognosis. They expressed understanding and appreciation.   Thank you for this consultation and allowing [place] to participate in the care of this patient. Neurology will sign off. Please call with questions. Pt will follow up with stroke clinic NP at  [facility] in about 4 weeks.   Marvel Plan, MD PhD Stroke Neurology 03/26/2018 11:59 AM

## 2018-03-26 NOTE — H&P (Signed)
History and Physical    Isaac Hicks OIZ:[id number] DOB: 12/31/1945 DOA: 03/25/2018  Referring MD/NP/PA:   PCP: Laqueta Due., MD   Patient coming from:  The patient is coming from home.  At baseline, pt is independent for most of ADL.        Chief Complaint: Difficulty speaking, slurred speech, gait instability  HPI: Isaac Hicks is a 73 y.o. male with medical history significant of cardiac, chronic back pain, who presents with difficulty speaking, slurred speech, gait instability.  Patient states that he started having difficulty speaking and slurred speech at about 7 PM when he was talking to his grandson on the phone.  He also had difficulty walking and gait instability.  Symptoms lasted for about 45 minutes then resolved spontaneously. Denies unilateral numbness or tingling his extremities.  No vision change or hearing loss.  No fall.  Patient denies chest pain, shortness of breath, cough.  No nausea vomiting, diarrhea, abdominal pain, symptoms of UTI.  No fever or chills.  ED Course: pt was found to have WBC 8.8, INR 0.95, negative troponin, negative UDS, negative urinalysis, alcohol level less than 10, electrolytes renal function okay, no tachycardia, oxygen saturation 100% on room air, temperature normal.  CT head is negative for acute intracranial abnormalities.  ED physician discussed with neurologist, who recommended traditional TIA work-up.  Review of Systems:   General: no fevers, chills, no body weight gain, has fatigue HEENT: no blurry vision, hearing changes or sore throat Respiratory: no dyspnea, coughing, wheezing CV: no chest pain, no palpitations GI: no nausea, vomiting, abdominal pain, diarrhea, constipation GU: no dysuria, burning on urination, increased urinary frequency, hematuria  Ext: no leg edema Neuro: Difficulty speaking, slurred speech, gait instability Skin: no rash, no skin tear. MSK: No muscle spasm, no deformity, no limitation of range of  movement in spin Heme: No easy bruising.  Travel history: No recent long distant travel.  Allergy: No Known Allergies  Past Medical History:  Diagnosis Date  . Back pain   . GERD (gastroesophageal reflux disease)   . Sciatica     History reviewed. No pertinent surgical history.  Social History:  reports that he has quit smoking. He has never used smokeless tobacco. He reports that he does not drink alcohol or use drugs.  Family History:  Family History  Problem Relation Age of Onset  . Heart disease Brother        CAD     Prior to Admission medications   Medication Sig Start Date End Date Taking? Authorizing Provider  oxyCODONE-acetaminophen (PERCOCET/ROXICET) 5-325 MG per tablet Take 2 tablets by mouth every 4 (four) hours as needed for pain. 01/01/12   Szekalski, Kaitlyn, PA-C  traMADol (ULTRAM) 50 MG tablet Take 1 tablet (50 mg total) by mouth every 6 (six) hours as needed for pain. 01/01/12   Emilia Beck, PA-C    Physical Exam: Vitals:   03/25/18 2330 03/26/18 0000 03/26/18 0030 03/26/18 0213  BP: 119/79 108/70 101/75 (!) 141/86  Pulse: 83 78 62 66  Resp: 14 17 17 16   Temp:    97.8 F (36.6 C)  TempSrc:    Oral  SpO2: 97% 97% 95% 100%  Weight:      Height:       General: Not in acute distress HEENT:       Eyes: PERRL, EOMI, no scleral icterus.       ENT: No discharge from the ears and nose, no pharynx injection, no tonsillar enlargement.  Neck: No JVD, no bruit, no mass felt. Heme: No neck lymph node enlargement. Cardiac: S1/S2, RRR, No murmurs, No gallops or rubs. Respiratory: No rales, wheezing, rhonchi or rubs. GI: Soft, nondistended, nontender, no rebound pain, no organomegaly, BS present. GU: No hematuria Ext: No pitting leg edema bilaterally. 2+DP/PT pulse bilaterally. Musculoskeletal: No joint deformities, No joint redness or warmth, no limitation of ROM in spin. Skin: No rashes.  Neuro: Alert, oriented X3, cranial nerves II-XII grossly  intact, moves all extremities normally. Muscle strength 5/5 in all extremities, sensation to light touch intact. Brachial reflex 2+ bilaterally. Negative Babinski's sign. Normal finger to nose test. Psych: Patient is not psychotic, no suicidal or hemocidal ideation.  Labs on Admission: I have personally reviewed following labs and imaging studies  CBC: Recent Labs  Lab 03/25/18 2006  WBC 8.8  NEUTROABS 5.1  HGB 14.7  HCT 44.6  MCV 89.9  PLT 162   Basic Metabolic Panel: Recent Labs  Lab 03/25/18 2006  NA 137  K 3.8  CL 103  CO2 27  GLUCOSE 95  BUN 13  CREATININE 0.88  CALCIUM 9.5   GFR: Estimated Creatinine Clearance: 74.8 mL/min (by C-G formula based on SCr of 0.88 mg/dL). Liver Function Tests: Recent Labs  Lab 03/25/18 2006  AST 24  ALT 18  ALKPHOS 91  BILITOT 0.7  PROT 7.0  ALBUMIN 4.4   No results for input(s): LIPASE, AMYLASE in the last 168 hours. No results for input(s): AMMONIA in the last 168 hours. Coagulation Profile: Recent Labs  Lab 03/25/18 2006  INR 0.95   Cardiac Enzymes: Recent Labs  Lab 03/25/18 2006  TROPONINI <0.03   BNP (last 3 results) No results for input(s): PROBNP in the last 8760 hours. HbA1C: No results for input(s): HGBA1C in the last 72 hours. CBG: Recent Labs  Lab 03/25/18 1952  GLUCAP 90   Lipid Profile: No results for input(s): CHOL, HDL, LDLCALC, TRIG, CHOLHDL, LDLDIRECT in the last 72 hours. Thyroid Function Tests: No results for input(s): TSH, T4TOTAL, FREET4, T3FREE, THYROIDAB in the last 72 hours. Anemia Panel: No results for input(s): VITAMINB12, FOLATE, FERRITIN, TIBC, IRON, RETICCTPCT in the last 72 hours. Urine analysis:    Component Value Date/Time   COLORURINE YELLOW 03/25/2018 2007   APPEARANCEUR CLEAR 03/25/2018 2007   LABSPEC <1.005 (L) 03/25/2018 2007   PHURINE 6.5 03/25/2018 2007   GLUCOSEU NEGATIVE 03/25/2018 2007   HGBUR NEGATIVE 03/25/2018 2007   BILIRUBINUR NEGATIVE 03/25/2018 2007    KETONESUR NEGATIVE 03/25/2018 2007   PROTEINUR NEGATIVE 03/25/2018 2007   NITRITE NEGATIVE 03/25/2018 2007   LEUKOCYTESUR NEGATIVE 03/25/2018 2007   Sepsis Labs: @LABRCNTIP (procalcitonin:4,lacticidven:4) )No results found for this or any previous visit (from the past 240 hour(s)).   Radiological Exams on Admission: Ct Head Wo Contrast  Result Date: 03/25/2018 CLINICAL DATA:  Dysarthria and gait difficulties, now back to baseline. No reported injury. EXAM: CT HEAD WITHOUT CONTRAST TECHNIQUE: Contiguous axial images were obtained from the base of the skull through the vertex without intravenous contrast. COMPARISON:  None. FINDINGS: Brain: No evidence of parenchymal hemorrhage or extra-axial fluid collection. No mass lesion, mass effect, or midline shift. No CT evidence of acute infarction. Nonspecific mild subcortical and periventricular white matter hypodensity, most in keeping with chronic small vessel ischemic change. Cerebral volume is age appropriate. No ventriculomegaly. Vascular: No acute abnormality. Skull: No evidence of calvarial fracture. Sinuses/Orbits: No fluid levels. Mucoperiosteal thickening throughout the left maxillary sinus. Other:  The mastoid air cells are  unopacified. IMPRESSION: 1. No evidence of acute intracranial abnormality. 2. 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 3. Chronic appearing left maxillary sinusitis. Electronically Signed   By: Delbert PhenixJason A Poff M.D.   On: 03/25/2018 20:20     EKG: Independently reviewed.  Sinus rhythm, QTC 442, LAD, poor R wave progression, nonspecific T wave change.  Assessment/Plan Principal Problem:   TIA (transient ischemic attack) Active Problems:   GERD (gastroesophageal reflux disease)   Back pain   TIA (transient ischemic attack): Patient had transient difficulty speaking, slurred speech and gait instability, lasted for about 45 minutes.  Currently patient is asymptomatic.  CT head is negative for  acute intracranial abnormalities.  Symptoms are concerning for TIA. Per EDP's note, "Dr. Madilyn Hookees spoke with neurology over the phone regarding need for other work-up and states that as patient symptoms have resolved we can do traditional stroke work-up".  - will place on tele bed for obs - Obtain MRI/MRA  - Check carotid dopplers  -  Start ASA and lipitor 40 mg daily - fasting lipid panel and HbA1c  - 2D transthoracic echocardiography  - swallowing screen. If fails, will get SLP - PT/OT consult  GERD: -Protonix prn   Back pain-chronic -continue gabapentin and Percocet   DVT ppx: SQ Lovenox Code Status: Full code Family Communication: None at bed side.   Disposition Plan:  Anticipate discharge back to previous home environment Consults called:  EDP discussed with neurologist  admission status: Obs / tele   Date of Service 03/26/2018    Lorretta HarpXilin Carrolyn Hilmes [facility] Pager [phone]  If 7PM-7AM, please contact night-coverage [email or web address] Password [email or web address] 03/26/2018, 2:58 AM

## 2018-03-26 NOTE — Plan of Care (Signed)
  Problem: Activity: Goal: Risk for activity intolerance will decrease Outcome: Progressing   Problem: Safety: Goal: Ability to remain free from injury will improve Outcome: Progressing   

## 2018-03-26 NOTE — Evaluation (Signed)
Occupational Therapy Evaluation Patient Details Name: Isaac Hicks MRN: [id number] DOB: Oct 05, 1945 Today's Date: 03/26/2018    History of Present Illness 73 y.o. male with medical history significant of cardiac, chronic back pain, who presents with difficulty speaking, slurred speech, gait instability. 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   Clinical Impression   Pt admitted with the above diagnoses and presents with below problem list. Pt will benefit from continued acute OT to address the below listed deficits and maximize independence with basic ADLs prior to d/c home. PTA pt was independent with ADLs, occasional use of cane to manage back pain. Pt presents with likely acute on chronic balance deficits. Close min guard to ambulate household distances, instability noted. 1 LOB turning in the hallway with pt able to self-correct. Spouse and son present.      Follow Up Recommendations  Outpatient OT;Supervision - Intermittent(OOB/mobility)    Equipment Recommendations  3 in 1 bedside commode    Recommendations for Other Services PT consult     Precautions / Restrictions Precautions Precautions: Fall Restrictions Weight Bearing Restrictions: No      Mobility Bed Mobility Overal bed mobility: Modified Independent                Transfers Overall transfer level: Needs assistance Equipment used: None Transfers: Sit to/from Stand Sit to Stand: Min guard         General transfer comment: close min guard for safety    Balance Overall balance assessment: Needs assistance Sitting-balance support: No upper extremity supported;Feet supported Sitting balance-Leahy Scale: Fair     Standing balance support: No upper extremity supported Standing balance-Leahy Scale: Poor Standing balance comment: fair static, poor dynamic                           ADL either performed or assessed with clinical judgement   ADL Overall  ADL's : Needs assistance/impaired Eating/Feeding: Set up;Sitting   Grooming: Min guard;Standing   Upper Body Bathing: Set up;Sitting   Lower Body Bathing: Min guard;Sit to/from stand   Upper Body Dressing : Set up;Sitting   Lower Body Dressing: Min guard;Sit to/from stand   Toilet Transfer: Min guard;Ambulation;Comfort height toilet;Grab bars   Toileting- Clothing Manipulation and Hygiene: Min guard;Sit to/from stand   Tub/ Shower Transfer: Walk-in shower;Min guard;Ambulation;3 in 1   Functional mobility during ADLs: Min guard General ADL Comments: Pt completed household distance functional mobility navigating turns and obstacles at close min guard level, instability noted. 1 minor LOB on initial turn into hallway with pt able to self correct. Pt reports he feels he would do better with shoes on vs hospital socks.      Vision Baseline Vision/History: Wears glasses Patient Visual Report: No change from baseline       Perception     Praxis      Pertinent Vitals/Pain Pain Assessment: No/denies pain     Hand Dominance     Extremity/Trunk Assessment Upper Extremity Assessment Upper Extremity Assessment: Overall WFL for tasks assessed   Lower Extremity Assessment Lower Extremity Assessment: Defer to PT evaluation   Cervical / Trunk Assessment Cervical / Trunk Assessment: Kyphotic   Communication Communication Communication: No difficulties   Cognition Arousal/Alertness: Awake/alert Behavior During Therapy: WFL for tasks assessed/performed Overall Cognitive Status: Within Functional Limits for tasks assessed  General Comments  Spouse and son present during session    Exercises     Shoulder Instructions      Home Living Family/patient expects to be discharged to:: Private residence Living Arrangements: Spouse/significant other Available Help at Discharge: Family Type of Home: House Home Access: Level  entry     Home Layout: One level     Bathroom Shower/Tub: Producer, television/film/videoWalk-in shower   Bathroom Toilet: Handicapped height     Home Equipment: Cane - single point          Prior Functioning/Environment Level of Independence: Independent        Comments: occasional use of cane for back pain managment        OT Problem List: Impaired balance (sitting and/or standing);Decreased knowledge of use of DME or AE;Decreased knowledge of precautions      OT Treatment/Interventions: Self-care/ADL training;DME and/or AE instruction;Therapeutic activities;Patient/family education;Balance training    OT Goals(Current goals can be found in the care plan section) Acute Rehab OT Goals Patient Stated Goal: home OT Goal Formulation: With patient/family Time For Goal Achievement: 04/09/18 Potential to Achieve Goals: Good ADL Goals Pt Will Perform Grooming: with modified independence;standing Pt Will Perform Lower Body Bathing: with modified independence;sit to/from stand Pt Will Perform Lower Body Dressing: with modified independence;sit to/from stand Pt Will Transfer to Toilet: with modified independence;ambulating  OT Frequency: Min 2X/week   Barriers to D/C:            Co-evaluation              AM-PAC OT "6 Clicks" Daily Activity     Outcome Measure Help from another person eating meals?: None Help from another person taking care of personal grooming?: None Help from another person toileting, which includes using toliet, bedpan, or urinal?: None Help from another person bathing (including washing, rinsing, drying)?: A Little Help from another person to put on and taking off regular upper body clothing?: None Help from another person to put on and taking off regular lower body clothing?: A Little 6 Click Score: 22   End of Session Equipment Utilized During Treatment: Gait belt  Activity Tolerance: Patient tolerated treatment well Patient left: in chair;with call bell/phone  within reach;with family/visitor present  OT Visit Diagnosis: Unsteadiness on feet (R26.81);Other abnormalities of gait and mobility (R26.89)                Time: [id number] OT Time Calculation (min): 21 min Charges:  OT General Charges $OT Visit: 1 Visit OT Evaluation $OT Eval Low Complexity: 1 Low  Raynald KempKathryn Colsen Modi, OT Acute Rehabilitation Services Pager: [phone] Office: [phone]   Pilar GrammesMathews, Phyllip Claw H 03/26/2018, 10:30 AM

## 2018-03-26 NOTE — Progress Notes (Signed)
SLP Cancellation Note  Patient Details Name: Isaac Hicks MRN: [id number] DOB: January 29, 1946   Cancelled treatment:       Reason Eval/Treat Not Completed: SLP screened, no needs identified, will sign off. Pt and family (wife and son) confirm pt has returned to baseline and cognitive-linguistic status is within normal limits. Pt was encouraged to notify PCP if difficulties arise once he returns to normal routines.  Isaac Hicks, Isaac Hicks Speech Language Pathologist [phone]  Isaac Hicks 03/26/2018, 12:26 PM

## 2018-03-26 NOTE — Progress Notes (Signed)
  Echocardiogram 2D Echocardiogram has been performed.  Isaac Hicks 03/26/2018, 9:02 AM

## 2018-03-26 NOTE — Discharge Summary (Signed)
PATIENT DETAILS Name: Isaac Hicks Age: 73 y.o. Sex: male Date of Birth: 09-15-1945 MRN: [id number]. Admitting Physician: Lorretta Harp, MD WUJ:[id number], Tor Netters., MD  Admit Date: 03/25/2018 Discharge date: 03/26/2018  Recommendations for Outpatient Follow-up:  1. Follow up with PCP in 1-2 weeks 2. Please obtain BMP/CBC in one week 3. Please get a CT scan of the chest (see below) 4. Please ensure follow-up with cardiology for a 30-day event monitor, and with stroke MD/neurology for post hospital follow-up visit 5. Please repeat lipid panel in 3 to 6 months  Admitted From:  Home  Disposition: Home   Home Health: No  Equipment/Devices: None  Discharge Condition: Stable  CODE STATUS: FULL CODE  Diet recommendation:  Heart Healthy   Brief Summary: See H&P, Labs, Consult and Test reports for all details in brief, patient patient is a 73 year old male with no medical problems-presented with transient slurred speech and gait instability.  MRI brain revealed a small acute infarct in the right superior cerebellum.  See below for further details  Brief Hospital Course: Acute CVA: Presented with transient slurred speech/gait instability-symptoms lasted for 5 to 10 minutes-has not reoccurred since admission.  MRI brain showed a small infarct in the right superior cerebellum-not sure at this point whether this is reflective of small vessel disease or embolic CVA.  Neurology evaluated this patient-patient prefers 30-day event monitor-rather than undergo further work-up in the hospital with a TEE and a loop recorder.  This MD spoke with cardiology team-they will arrange for a 30-day event monitor in the outpatient setting.  Patient will be discharged on aspirin/Plavix for 3 weeks-followed by aspirin alone and high intensity statin.  He has no focal neurological deficits on exam.  Carotid Doppler did not show any significant stenosis-echocardiogram did not show any embolic source.  Telemetry  was negative for A. Fib.  Incidental finding of a possible retrocardiac structure either near the aorta or esophagus on transthoracic echocardiogram-this MD discussed with patient, his spouse and son at bedside-recommendations were made for outpatient elective CT scan of the chest.  Both patient and family aware that in some cases these findings can be significant-and turn out to be malignancy.  Patient and family were asked to discuss this finding with the PCP and pursue an outpatient CT scan-all of them are agreeable with this plan.  Procedures/Studies: None  Discharge Diagnoses:  Principal Problem:  Acute CVA Active Problems:   GERD (gastroesophageal reflux disease)   Back pain   Discharge Instructions:  Activity:  As tolerated   Discharge Instructions    Ambulatory referral to Neurology   Complete by:  As directed    An appointment is requested in approximately: 4 weeks   Diet - low sodium heart healthy   Complete by:  As directed    Discharge instructions   Complete by:  As directed    Follow with Primary MD  Laqueta Due., MD in 1 week  Follow with neurology/stroke clinic-they will be calling you with a follow-up appointment  Follow-up with cardiology-they will be calling you for a follow-up appointment to place a 30-day heart monitor  Take aspirin/Plavix for 3 weeks, then continue only aspirin (discontinue Plavix after 3 weeks)  You had a incidental finding of a vague structure behind your heart on ultrasound of your heart-please ask your primary care practitioner to get a CT scan of your chest.  In some cases these findings can be significant-and can turn out to be cancer.  Please get a  complete blood count and chemistry panel checked by your Primary MD at your next visit, and again as instructed by your Primary MD.  Get Medicines reviewed and adjusted: Please take all your medications with you for your next visit with your Primary MD  Laboratory/radiological  data: Please request your Primary MD to go over all hospital tests and procedure/radiological results at the follow up, please ask your Primary MD to get all Hospital records sent to his/her office.  In some cases, they will be blood work, cultures and biopsy results pending at the time of your discharge. Please request that your primary care M.D. follows up on these results.  Also Note the following: If you experience worsening of your admission symptoms, develop shortness of breath, life threatening emergency, suicidal or homicidal thoughts you must seek medical attention immediately by calling 911 or calling your MD immediately  if symptoms less severe.  You must read complete instructions/literature along with all the possible adverse reactions/side effects for all the Medicines you take and that have been prescribed to you. Take any new Medicines after you have completely understood and accpet all the possible adverse reactions/side effects.   Do not drive when taking Pain medications or sleeping medications (Benzodaizepines)  Do not take more than prescribed Pain, Sleep and Anxiety Medications. It is not advisable to combine anxiety,sleep and pain medications without talking with your primary care practitioner  Special Instructions: If you have smoked or chewed Tobacco  in the last 2 yrs please stop smoking, stop any regular Alcohol  and or any Recreational drug use.  Wear Seat belts while driving.  Please note: You were cared for by a hospitalist during your hospital stay. Once you are discharged, your primary care physician will handle any further medical issues. Please note that NO REFILLS for any discharge medications will be authorized once you are discharged, as it is imperative that you return to your primary care physician (or establish a relationship with a primary care physician if you do not have one) for your post hospital discharge needs so that they can reassess your need for  medications and monitor your lab values.   Increase activity slowly   Complete by:  As directed      Allergies as of 03/26/2018   No Known Allergies     Medication List    TAKE these medications   aspirin 81 MG EC tablet Take 1 tablet (81 mg total) by mouth daily.   atorvastatin 40 MG tablet Commonly known as:  LIPITOR Take 1 tablet (40 mg total) by mouth daily at 6 PM.   clopidogrel 75 MG tablet Commonly known as:  PLAVIX Take 1 tablet (75 mg total) by mouth daily. Take aspirin/Plavix for 3 weeks, then continue only aspirin (discontinue Plavix after 3 weeks)   gabapentin 100 MG capsule Commonly known as:  NEURONTIN Take 100 mg by mouth 2 (two) times daily.   multivitamin with minerals Tabs tablet Take 1 tablet by mouth daily.   saw palmetto 160 MG capsule Take 160 mg by mouth 2 (two) times daily.            Durable Medical Equipment  (From admission, onward)         Start     Ordered   03/26/18 1206  For home use only DME Bedside commode  Once    Question:  Patient needs a bedside commode to treat with the following condition  Answer:  CVA (cerebral vascular accident) (HCC)  03/26/18 1206         Follow-up Information    Laqueta Due., MD. Schedule an appointment as soon as possible for a visit in 1 week(s).   Specialty:  Internal Medicine Contact information: [address] [place] [address] [phone]        [facility] Follow up.   Why:  cardiology office will call you in a few days to place a 30-day heart monitor Contact information: [address] [place] [address] [phone]       Micki Riley, MD. Schedule an appointment as soon as possible for a visit in 1 month(s).   Specialties:  Neurology, Radiology Why:  Follow-up with neurology/stroke clinic-office will call you in a few days for a follow-up appointment Contact information: [address] [place] [address] [phone]          No Known Allergies  Consultations:   neurology  Other Procedures/Studies: Ct Head Wo Contrast  Result Date: 03/25/2018 CLINICAL DATA:  Dysarthria and gait difficulties, now back to baseline. No reported injury. EXAM: CT HEAD WITHOUT CONTRAST TECHNIQUE: Contiguous axial images were obtained from the base of the skull through the vertex without intravenous contrast. COMPARISON:  None. FINDINGS: Brain: No evidence of parenchymal hemorrhage or extra-axial fluid collection. No mass lesion, mass effect, or midline shift. No CT evidence of acute infarction. Nonspecific mild subcortical and periventricular white matter hypodensity, most in keeping with chronic small vessel ischemic change. Cerebral volume is age appropriate. No ventriculomegaly. Vascular: No acute abnormality. Skull: No evidence of calvarial fracture. Sinuses/Orbits: No fluid levels. Mucoperiosteal thickening throughout the left maxillary sinus. Other:  The mastoid air cells are unopacified. IMPRESSION: 1. No evidence of acute intracranial abnormality. 2. 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 3. Chronic appearing left maxillary sinusitis. Electronically Signed   By: Delbert Phenix M.D.   On: 03/25/2018 20:20   Mr Brain Wo Contrast  Result Date: 03/26/2018 CLINICAL DATA:  Difficulty speaking with slurred speech and gait instability. EXAM: MRI HEAD WITHOUT CONTRAST MRA HEAD WITHOUT CONTRAST TECHNIQUE: Multiplanar, multiecho pulse sequences of the brain and surrounding structures were obtained without intravenous contrast. Angiographic images of the head were obtained using MRA technique without contrast. COMPARISON:  None. FINDINGS: MRI HEAD FINDINGS Brain: Small acute infarction along the upper right cerebellum. Mild chronic small vessel ischemic type change in the cerebral white matter. No hemorrhage, hydrocephalus, collection, or masslike  finding. Brain volume is normal. Vascular: Arterial findings below. Skull and upper cervical spine: Negative for marrow lesion. C3-4 disc degeneration and ridging. Sinuses/Orbits: Mild mucosal thickening in the left maxillary sinus. MRA HEAD FINDINGS Motion artifact at the level of the A1 and right M1 segments. Vessels, including the right superior cerebellar artery, are smooth and diffusely patent. No visible aneurysm. IMPRESSION: 1. 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 2. Negative intracranial MRA. Electronically Signed   By: Marnee Spring M.D.   On: 03/26/2018 07:31   Mr Shirlee Latch VO Contrast  Result Date: 03/26/2018 CLINICAL DATA:  Difficulty speaking with slurred speech and gait instability. EXAM: MRI HEAD WITHOUT CONTRAST MRA HEAD WITHOUT CONTRAST TECHNIQUE: Multiplanar, multiecho pulse sequences of the brain and surrounding structures were obtained without intravenous contrast. Angiographic images of the head were obtained using MRA technique without contrast. COMPARISON:  None. FINDINGS: MRI HEAD FINDINGS Brain: Small acute infarction along the upper right cerebellum. Mild chronic  small vessel ischemic type change in the cerebral white matter. No hemorrhage, hydrocephalus, collection, or masslike finding. Brain volume is normal. Vascular: Arterial findings below. Skull and upper cervical spine: Negative for marrow lesion. C3-4 disc degeneration and ridging. Sinuses/Orbits: Mild mucosal thickening in the left maxillary sinus. MRA HEAD FINDINGS Motion artifact at the level of the A1 and right M1 segments. Vessels, including the right superior cerebellar artery, are smooth and diffusely patent. No visible aneurysm. IMPRESSION: 1. 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 2. Negative intracranial MRA. Electronically Signed   By: Marnee Spring M.D.   On: 03/26/2018 07:31   Vas US Carotid (at [facility])  Result Date: 03/26/2018 Carotid Arterial Duplex Study Indications:        CVA, Speech disturbance and Gait difficulties. Comparison Study:  No prior study on file for comparison Performing Technologist: Sherren Kerns RVS  Examination Guidelines: A complete evaluation includes B-mode imaging, spectral Doppler, color Doppler, and power Doppler as needed of all accessible portions of each vessel. Bilateral testing is considered an integral part of a complete examination. Limited examinations for reoccurring indications may be performed as noted.  Right Carotid Findings: +----------+--------+--------+--------+--------+------------------+           PSV cm/sEDV cm/sStenosisDescribeComments           +----------+--------+--------+--------+--------+------------------+ CCA Prox  89      18                      intimal thickening +----------+--------+--------+--------+--------+------------------+ CCA Distal111     30                      intimal thickening +----------+--------+--------+--------+--------+------------------+ ICA Prox  65      21                                         +----------+--------+--------+--------+--------+------------------+ ICA Distal64      27                                         +----------+--------+--------+--------+--------+------------------+ ECA       91      14                                         +----------+--------+--------+--------+--------+------------------+ +----------+--------+-------+--------+-------------------+           PSV cm/sEDV cmsDescribeArm Pressure (mmHG) +----------+--------+-------+--------+-------------------+ [id number]                                         +----------+--------+-------+--------+-------------------+ +---------+--------+--+--------+-+ VertebralPSV cm/s35EDV cm/s9 +---------+--------+--+--------+-+  Left Carotid Findings: +----------+--------+--------+--------+--------+------------------+           PSV cm/sEDV cm/sStenosisDescribeComments            +----------+--------+--------+--------+--------+------------------+ CCA Prox                                  intimal thickening +----------+--------+--------+--------+--------+------------------+ CCA Distal110     23  intimal thickening +----------+--------+--------+--------+--------+------------------+ ICA Prox  76      28                                         +----------+--------+--------+--------+--------+------------------+ ICA Distal86      32                                         +----------+--------+--------+--------+--------+------------------+ ECA       100     18                                         +----------+--------+--------+--------+--------+------------------+ +----------+--------+--------+--------+-------------------+ SubclavianPSV cm/sEDV cm/sDescribeArm Pressure (mmHG) +----------+--------+--------+--------+-------------------+           85                                          +----------+--------+--------+--------+-------------------+ +---------+--------+--+--------+--+ VertebralPSV cm/s45EDV cm/s15 +---------+--------+--+--------+--+  Summary: Right Carotid: The extracranial vessels were near-normal with only minimal wall                thickening or plaque. Left Carotid: The extracranial vessels were near-normal with only minimal wall               thickening or plaque. Vertebrals:  Bilateral vertebral arteries demonstrate antegrade flow. Subclavians: Normal flow hemodynamics were seen in bilateral subclavian              arteries. *See table(s) above for measurements and observations.     Preliminary      TODAY-DAY OF DISCHARGE:  Subjective:   Isaac Hicks today has no headache,no chest abdominal pain,no new weakness tingling or numbness, feels much better wants to go home today.   Objective:   Blood pressure 139/83, pulse 64, temperature 97.8 F (36.6 C), resp. rate 18, height 5\' 9"  (1.753  m), weight 78 kg, SpO2 100 %. No intake or output data in the 24 hours ending 03/26/18 1214 Filed Weights   03/25/18 1955  Weight: 78 kg    Exam: Awake Alert, Oriented *3, No new F.N deficits, Normal affect [place].AT,PERRAL Supple Neck,No JVD, No cervical lymphadenopathy appriciated.  Symmetrical Chest wall movement, Good air movement bilaterally, CTAB RRR,No Gallops,Rubs or new Murmurs, No Parasternal Heave +ve B.Sounds, Abd Soft, Non tender, No organomegaly appriciated, No rebound -guarding or rigidity. No Cyanosis, Clubbing or edema, No new Rash or bruise   PERTINENT RADIOLOGIC STUDIES: Ct Head Wo Contrast  Result Date: 03/25/2018 CLINICAL DATA:  Dysarthria and gait difficulties, now back to baseline. No reported injury. EXAM: CT HEAD WITHOUT CONTRAST TECHNIQUE: Contiguous axial images were obtained from the base of the skull through the vertex without intravenous contrast. COMPARISON:  None. FINDINGS: Brain: No evidence of parenchymal hemorrhage or extra-axial fluid collection. No mass lesion, mass effect, or midline shift. No CT evidence of acute infarction. Nonspecific mild subcortical and periventricular white matter hypodensity, most in keeping with chronic small vessel ischemic change. Cerebral volume is age appropriate. No ventriculomegaly. Vascular: No acute abnormality. Skull: No evidence of calvarial fracture. Sinuses/Orbits: No fluid levels. Mucoperiosteal thickening throughout the left maxillary sinus. Other:  The  mastoid air cells are unopacified. IMPRESSION: 1. No evidence of acute intracranial abnormality. 2. 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 3. Chronic appearing left maxillary sinusitis. Electronically Signed   By: Delbert Phenix M.D.   On: 03/25/2018 20:20   Mr Brain Wo Contrast  Result Date: 03/26/2018 CLINICAL DATA:  Difficulty speaking with slurred speech and gait instability. EXAM: MRI HEAD WITHOUT CONTRAST MRA HEAD WITHOUT CONTRAST  TECHNIQUE: Multiplanar, multiecho pulse sequences of the brain and surrounding structures were obtained without intravenous contrast. Angiographic images of the head were obtained using MRA technique without contrast. COMPARISON:  None. FINDINGS: MRI HEAD FINDINGS Brain: Small acute infarction along the upper right cerebellum. Mild chronic small vessel ischemic type change in the cerebral white matter. No hemorrhage, hydrocephalus, collection, or masslike finding. Brain volume is normal. Vascular: Arterial findings below. Skull and upper cervical spine: Negative for marrow lesion. C3-4 disc degeneration and ridging. Sinuses/Orbits: Mild mucosal thickening in the left maxillary sinus. MRA HEAD FINDINGS Motion artifact at the level of the A1 and right M1 segments. Vessels, including the right superior cerebellar artery, are smooth and diffusely patent. No visible aneurysm. IMPRESSION: 1. 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 2. Negative intracranial MRA. Electronically Signed   By: Marnee Spring M.D.   On: 03/26/2018 07:31   Mr Shirlee Latch ZO Contrast  Result Date: 03/26/2018 CLINICAL DATA:  Difficulty speaking with slurred speech and gait instability. EXAM: MRI HEAD WITHOUT CONTRAST MRA HEAD WITHOUT CONTRAST TECHNIQUE: Multiplanar, multiecho pulse sequences of the brain and surrounding structures were obtained without intravenous contrast. Angiographic images of the head were obtained using MRA technique without contrast. COMPARISON:  None. FINDINGS: MRI HEAD FINDINGS Brain: Small acute infarction along the upper right cerebellum. Mild chronic small vessel ischemic type change in the cerebral white matter. No hemorrhage, hydrocephalus, collection, or masslike finding. Brain volume is normal. Vascular: Arterial findings below. Skull and upper cervical spine: Negative for marrow lesion. C3-4 disc degeneration and ridging. Sinuses/Orbits: Mild mucosal thickening in the left maxillary sinus. MRA HEAD  FINDINGS Motion artifact at the level of the A1 and right M1 segments. Vessels, including the right superior cerebellar artery, are smooth and diffusely patent. No visible aneurysm. IMPRESSION: 1. 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 2. Negative intracranial MRA. Electronically Signed   By: Marnee Spring M.D.   On: 03/26/2018 07:31   Vas US Carotid (at [facility])  Result Date: 03/26/2018 Carotid Arterial Duplex Study Indications:       CVA, Speech disturbance and Gait difficulties. Comparison Study:  No prior study on file for comparison Performing Technologist: Sherren Kerns RVS  Examination Guidelines: A complete evaluation includes B-mode imaging, spectral Doppler, color Doppler, and power Doppler as needed of all accessible portions of each vessel. Bilateral testing is considered an integral part of a complete examination. Limited examinations for reoccurring indications may be performed as noted.  Right Carotid Findings: +----------+--------+--------+--------+--------+------------------+           PSV cm/sEDV cm/sStenosisDescribeComments           +----------+--------+--------+--------+--------+------------------+ CCA Prox  89      18                      intimal thickening +----------+--------+--------+--------+--------+------------------+ CCA Distal111     30                      intimal thickening +----------+--------+--------+--------+--------+------------------+ ICA Prox  65      21                                         +----------+--------+--------+--------+--------+------------------+ ICA Distal64      27                                         +----------+--------+--------+--------+--------+------------------+ ECA       91      14                                         +----------+--------+--------+--------+--------+------------------+ +----------+--------+-------+--------+-------------------+           PSV cm/sEDV  cmsDescribeArm Pressure (mmHG) +----------+--------+-------+--------+-------------------+ [id number]                                         +----------+--------+-------+--------+-------------------+ +---------+--------+--+--------+-+ VertebralPSV cm/s35EDV cm/s9 +---------+--------+--+--------+-+  Left Carotid Findings: +----------+--------+--------+--------+--------+------------------+           PSV cm/sEDV cm/sStenosisDescribeComments           +----------+--------+--------+--------+--------+------------------+ CCA Prox                                  intimal thickening +----------+--------+--------+--------+--------+------------------+ CCA Distal110     23                      intimal thickening +----------+--------+--------+--------+--------+------------------+ ICA Prox  76      28                                         +----------+--------+--------+--------+--------+------------------+ ICA Distal86      32                                         +----------+--------+--------+--------+--------+------------------+ ECA       100     18                                         +----------+--------+--------+--------+--------+------------------+ +----------+--------+--------+--------+-------------------+ SubclavianPSV cm/sEDV cm/sDescribeArm Pressure (mmHG) +----------+--------+--------+--------+-------------------+           85                                          +----------+--------+--------+--------+-------------------+ +---------+--------+--+--------+--+ VertebralPSV cm/s45EDV cm/s15 +---------+--------+--+--------+--+  Summary: Right Carotid: The extracranial vessels were near-normal with only minimal wall                thickening or plaque. Left Carotid: The extracranial vessels were near-normal with only minimal wall               thickening or plaque. Vertebrals:  Bilateral vertebral arteries demonstrate antegrade  flow.  Subclavians: Normal flow hemodynamics were seen in bilateral subclavian              arteries. *See table(s) above for measurements and observations.     Preliminary      PERTINENT LAB RESULTS: CBC: Recent Labs    03/25/18 2006  WBC 8.8  HGB 14.7  HCT 44.6  PLT 162   CMET CMP     Component Value Date/Time   NA 137 03/25/2018 2006   K 3.8 03/25/2018 2006   CL 103 03/25/2018 2006   CO2 27 03/25/2018 2006   GLUCOSE 95 03/25/2018 2006   BUN 13 03/25/2018 2006   CREATININE 0.88 03/25/2018 2006   CALCIUM 9.5 03/25/2018 2006   PROT 7.0 03/25/2018 2006   ALBUMIN 4.4 03/25/2018 2006   AST 24 03/25/2018 2006   ALT 18 03/25/2018 2006   ALKPHOS 91 03/25/2018 2006   BILITOT 0.7 03/25/2018 2006   GFRNONAA >60 03/25/2018 2006   GFRAA >60 03/25/2018 2006    GFR Estimated Creatinine Clearance: 74.8 mL/min (by C-G formula based on SCr of 0.88 mg/dL). No results for input(s): LIPASE, AMYLASE in the last 72 hours. Recent Labs    03/25/18 2006  TROPONINI <0.03   Invalid input(s): POCBNP No results for input(s): DDIMER in the last 72 hours. Recent Labs    03/26/18 0348  HGBA1C 5.2   Recent Labs    03/26/18 0348  CHOL 189  HDL 37*  LDLCALC 132*  TRIG 102  CHOLHDL 5.1   No results for input(s): TSH, T4TOTAL, T3FREE, THYROIDAB in the last 72 hours.  Invalid input(s): FREET3 No results for input(s): VITAMINB12, FOLATE, FERRITIN, TIBC, IRON, RETICCTPCT in the last 72 hours. Coags: Recent Labs    03/25/18 2006  INR 0.95   Microbiology: No results found for this or any previous visit (from the past 240 hour(s)).  FURTHER DISCHARGE INSTRUCTIONS:  Get Medicines reviewed and adjusted: Please take all your medications with you for your next visit with your Primary MD  Laboratory/radiological data: Please request your Primary MD to go over all hospital tests and procedure/radiological results at the follow up, please ask your Primary MD to get all Hospital records sent to  his/her office.  In some cases, they will be blood work, cultures and biopsy results pending at the time of your discharge. Please request that your primary care M.D. goes through all the records of your hospital data and follows up on these results.  Also Note the following: If you experience worsening of your admission symptoms, develop shortness of breath, life threatening emergency, suicidal or homicidal thoughts you must seek medical attention immediately by calling 911 or calling your MD immediately  if symptoms less severe.  You must read complete instructions/literature along with all the possible adverse reactions/side effects for all the Medicines you take and that have been prescribed to you. Take any new Medicines after you have completely understood and accpet all the possible adverse reactions/side effects.   Do not drive when taking Pain medications or sleeping medications (Benzodaizepines)  Do not take more than prescribed Pain, Sleep and Anxiety Medications. It is not advisable to combine anxiety,sleep and pain medications without talking with your primary care practitioner  Special Instructions: If you have smoked or chewed Tobacco  in the last 2 yrs please stop smoking, stop any regular Alcohol  and or any Recreational drug use.  Wear Seat belts while driving.  Please note: You were cared for by a hospitalist  during your hospital stay. Once you are discharged, your primary care physician will handle any further medical issues. Please note that NO REFILLS for any discharge medications will be authorized once you are discharged, as it is imperative that you return to your primary care physician (or establish a relationship with a primary care physician if you do not have one) for your post hospital discharge needs so that they can reassess your need for medications and monitor your lab values.  Total Time spent coordinating discharge including counseling, education and face to  face time equals 35 minutes.  SignedJeoffrey Massed: Mora Pedraza 03/26/2018 12:14 PM

## 2018-03-26 NOTE — Progress Notes (Signed)
Jayme Hilario to be D/C'd Home per MD order.  Discussed with the patient and all questions fully answered.  VSS, Skin clean, dry and intact without evidence of skin break down, no evidence of skin tears noted. IV catheter discontinued intact. Site without signs and symptoms of complications. Dressing and pressure applied.  An After Visit Summary was printed and given to the patient. Patient received prescription. Reviewed and provided patient with stroke recovery book.   D/c education completed with patient/family including follow up instructions, medication list, d/c activities limitations if indicated, with other d/c instructions as indicated by MD - patient able to verbalize understanding, all questions fully answered.   Patient instructed to return to ED, call 911, or call MD for any changes in condition.   Patient escorted by nurse and family to exit, and D/C home via private auto.  Garry Heater 03/26/2018 3:38 PM

## 2018-03-26 NOTE — Progress Notes (Signed)
Isaac Hicks is a 73 y.o. male patient admitted from ED awake, alert - oriented  X 4 - no acute distress noted.  VSS - Blood pressure (!) 141/86, pulse 66, temperature 97.8 F (36.6 C), temperature source Oral, resp. rate 16, height 5\' 9"  (1.753 m), weight 78 kg, SpO2 100 %.    IV in place, occlusive dsg intact without redness.  Orientation to room, and floor completed with information packet given to patient/family.  Patient declined safety video at this time.  Admission INP armband ID verified with patient/family, and in place.   SR up x 2, fall assessment complete, with patient and family able to verbalize understanding of risk associated with falls, and verbalized understanding to call nsg before up out of bed.  Call light within reach, patient able to voice, and demonstrate understanding.  Skin, clean-dry- intact without evidence of bruising, or skin tears.   No evidence of skin break down noted on exam.     Will cont to eval and treat per MD orders.  Dierdre Forth, RN 03/26/2018 2:14 AM

## 2018-03-31 ENCOUNTER — Other Ambulatory Visit: Payer: Self-pay | Admitting: *Deleted

## 2018-03-31 DIAGNOSIS — I639 Cerebral infarction, unspecified: Secondary | ICD-10-CM

## 2018-03-31 DIAGNOSIS — G459 Transient cerebral ischemic attack, unspecified: Secondary | ICD-10-CM

## 2018-04-09 ENCOUNTER — Ambulatory Visit (INDEPENDENT_AMBULATORY_CARE_PROVIDER_SITE_OTHER): Payer: Medicare Other

## 2018-04-09 ENCOUNTER — Other Ambulatory Visit: Payer: Self-pay | Admitting: Neurology

## 2018-04-09 DIAGNOSIS — I4891 Unspecified atrial fibrillation: Secondary | ICD-10-CM

## 2018-04-09 DIAGNOSIS — I639 Cerebral infarction, unspecified: Secondary | ICD-10-CM | POA: Diagnosis not present

## 2018-05-05 ENCOUNTER — Ambulatory Visit: Payer: Medicare Other | Admitting: Adult Health

## 2018-05-05 ENCOUNTER — Encounter: Payer: Self-pay | Admitting: Adult Health

## 2018-05-05 VITALS — BP 120/66 | HR 50 | Ht 69.0 in | Wt 176.0 lb

## 2018-05-05 DIAGNOSIS — I63531 Cerebral infarction due to unspecified occlusion or stenosis of right posterior cerebral artery: Secondary | ICD-10-CM

## 2018-05-05 DIAGNOSIS — E785 Hyperlipidemia, unspecified: Secondary | ICD-10-CM | POA: Diagnosis not present

## 2018-05-05 MED ORDER — ATORVASTATIN CALCIUM 40 MG PO TABS: 40.0000 mg | ORAL_TABLET | Freq: Every day | ORAL | 3 refills | Status: AC

## 2018-05-05 NOTE — Progress Notes (Signed)
I agree with the above plan 

## 2018-05-05 NOTE — Progress Notes (Signed)
[facility] [address] [place]. [place] [address] [phone]       OFFICE FOLLOW UP NOTE  Mr. Jaykon Motts Date of Birth:  [birth year] Medical Record Number:  [id number]   Reason for Referral:  hospital stroke follow up  CHIEF COMPLAINT:  Chief Complaint  Patient presents with  . Follow-up    Follow up hospital Stroke room 9 pt with   . Hospitalization Follow-up    Rm 9, wife Liborio Nixon.      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 History obtained from patient, wife and chart review. Reviewed all radiology images and labs personally.  Mr. Liberti is a 73 y.o. male with PMH of LBP and sciatica, and HLD who was admitted for transient slurred speech and imbalance.  CT head reviewed and negative for acute infarct.  MRI head reviewed and showed small right SCA infarcts.  MRA, C [place] and 2D echo unremarkable.  LDL 132 and A1c 5.2.  Due to location of stroke, could be secondary to small vessel disease versus embolic pattern and per notes, discussion with patient and family regarding 30-day cardiac event monitor and TEE/loop.  Patient and family preferred 30-day cardiac event monitor to rule out atrial fibrillation at that time but consider TEE and loop if 30-day monitor negative.  Initiated DAPT for 3 weeks and aspirin alone along with initiation of atorvastatin 40 mg daily.  Of note, incidental finding of possible retrocardiac structure either near the aorta or esophagus found on echo and recommended outpatient follow-up to rule out malignancy.  Therapies recommended outpatient PT/OT and was discharged in stable condition.  Mr. Villapando is being seen today for hospital discharge follow-up and is accompanied by his wife.  He continues to do well from a stroke standpoint without residual deficits or recurring symptoms.  30-day cardiac event monitor  started 04/09/2018.  He continues on aspirin without side effects of bleeding or bruising.  Continues on atorvastatin without side effects myalgias.  Blood pressure today satisfactory at 120/66.  He has returned back to all prior activities without complication.  Denies new or worsening stroke/TIA symptoms.   ROS:   14 system review of systems performed and negative with exception of see HPI  PMH:  Past Medical History:  Diagnosis Date  . Back pain   . GERD (gastroesophageal reflux disease)   . Sciatica     PSH: No past surgical history on file.  Social History:  Social History   Socioeconomic History  . Marital status: Married    Spouse name: Not on file  . Number of children: Not on file  . Years of education: Not on file  . Highest education level: Not on file  Occupational History  . Not on file  Social Needs  . Financial resource strain: Not on file  . Food insecurity:    Worry: Not on file    Inability: Not on file  . Transportation needs:    Medical: Not on file    Non-medical: Not on file  Tobacco Use  . Smoking status: Former Games developer  . Smokeless tobacco: Never Used  Substance and Sexual Activity  . Alcohol use: No  . Drug use: No  . Sexual activity: Not on file  Lifestyle  . Physical activity:    Days per week: Not on file    Minutes per session: Not on file  .  Stress: Not on file  Relationships  . Social connections:    Talks on phone: Not on file    Gets together: Not on file    Attends religious service: Not on file    Active member of club or organization: Not on file    Attends meetings of clubs or organizations: Not on file    Relationship status: Not on file  . Intimate partner violence:    Fear of current or ex partner: Not on file    Emotionally abused: Not on file    Physically abused: Not on file    Forced sexual activity: Not on file  Other Topics Concern  . Not on file  Social History Narrative  . Not on file    Family History:    Family History  Problem Relation Age of Onset  . Heart disease Brother        CAD    Medications:   Current Outpatient Medications on File Prior to Visit  Medication Sig Dispense Refill  . aspirin EC 81 MG EC tablet Take 1 tablet (81 mg total) by mouth daily. 30 tablet 0  . atorvastatin (LIPITOR) 40 MG tablet Take 1 tablet (40 mg total) by mouth daily at 6 PM. 30 tablet 0  . atorvastatin (LIPITOR) 40 MG tablet Take by mouth.    . clopidogrel (PLAVIX) 75 MG tablet Take 1 tablet (75 mg total) by mouth daily. Take aspirin/Plavix for 3 weeks, then continue only aspirin (discontinue Plavix after 3 weeks) 21 tablet 0  . gabapentin (NEURONTIN) 100 MG capsule Take 100 mg by mouth 2 (two) times daily.    Marland Kitchen. gabapentin (NEURONTIN) 300 MG capsule TAKE 1 CAPSULE BY MOUTH TWICE A DAY    . Multiple Vitamin (MULTIVITAMIN WITH MINERALS) TABS tablet Take 1 tablet by mouth daily.    . saw palmetto 160 MG capsule Take 160 mg by mouth 2 (two) times daily.     No current facility-administered medications on file prior to visit.     Allergies:  No Known Allergies   Physical Exam  Vitals:   05/05/18 0838  BP: 120/66  Pulse: (!) 50  Weight: 176 lb (79.8 kg)  Height: 5\' 9"  (1.753 m)   Body mass index is 25.99 kg/m.  Visual Acuity Screening   Right eye Left eye Both eyes  Without correction:     With correction: 20/40 20/30     General: well developed, well nourished, pleasant elderly Caucasian male, seated, in no evident distress Head: head normocephalic and atraumatic.   Neck: supple with no carotid or supraclavicular bruits Cardiovascular: regular rate and rhythm, no murmurs Musculoskeletal: no deformity Skin:  no rash/petichiae Vascular:  Normal pulses all extremities  Neurologic Exam Mental Status: Awake and fully alert. Oriented to place and time. Recent and remote memory intact. Attention span, concentration and fund of knowledge appropriate. Mood and affect appropriate.  Cranial  Nerves: Fundoscopic exam reveals sharp disc margins. Pupils equal, briskly reactive to light. Extraocular movements full without nystagmus. Visual fields full to confrontation. Hearing intact. Facial sensation intact. Face, tongue, palate moves normally and symmetrically.  Motor: Normal bulk and tone. Normal strength in all tested extremity muscles.  Left foot drop chronic Sensory.: intact to touch , pinprick , position and vibratory sensation.  Coordination: Rapid alternating movements normal in all extremities. Finger-to-nose and heel-to-shin performed accurately bilaterally. Gait and Station: Arises from chair without difficulty. Stance is normal. Gait demonstrates normal stride length and balance. Able to heel,  toe and tandem walk without difficulty.  Reflexes: 1+ and symmetric. Toes downgoing.    NIHSS  0 Modified Rankin  0    Diagnostic Data (Labs, Imaging, Testing)  CT HEAD WO CONTRAST 03/25/2018 IMPRESSION: 1. No evidence of acute intracranial abnormality. 2. 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 3. Chronic appearing left maxillary sinusitis.  MR BRAIN WO CONTRAST MR MRA HEAD  03/26/2018 IMPRESSION: 1. 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 2. Negative intracranial MRA.  ECHOCARDIOGRAM 03/26/2018 Study Conclusions - Left ventricle: The cavity size was normal. There was mild focal   basal hypertrophy of the septum. Systolic function was normal.   The estimated ejection fraction was in the range of 60% to 65%.   Wall motion was normal; there were no regional wall motion   abnormalities. Left ventricular diastolic function parameters   were normal. - Mitral valve: There was mild regurgitation. Valve area by   pressure half-time: 2.29 cm^2. - Atrial septum: No defect or patent foramen ovale was identified. - Impressions: Retro cardiac structure ? aorta vs esophagus can   consider CT chest to further evaluate.   ASSESSMENT:  Caine Siharath is a 73 y.o. 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 Vascular risk factors include HLD.     PLAN:  1. Right SCA infarct: Continue aspirin 81 mg daily  and atorvastatin for secondary stroke prevention. Maintain strict control of hypertension with blood pressure goal below 130/90, diabetes with hemoglobin A1c goal below 6.5% and cholesterol with LDL cholesterol (bad cholesterol) goal below 70 mg/dL.  I also advised the patient to eat a healthy diet with plenty of whole grains, cereals, fruits and vegetables, exercise regularly with at least 30 minutes of continuous activity daily and maintain ideal body weight. 2. HLD: Advised to continue current treatment regimen along with continued follow-up with PCP for future prescribing and ongoing monitoring.  Will obtain lipid panel today to ensure adequate management but did advise to continue to follow-up with PCP in future 3. Complete 30-day cardiac event monitor to rule out atrial fibrillation.  Did advise him that if monitor is negative, may have to consider TEE/loop   He is currently being followed by [facility] neurology and recommended continue follow-up in their office.  Did advise him to call our office if he has any questions or concerns   Greater than 50% of time during this 25 minute visit was spent on counseling, explanation of diagnosis of right SCA infarct, reviewing risk factor management of HLD, planning of further management along with potential future management, and discussion with patient and family answering all questions.    George Hugh, AGNP-BC  [facility] [address] [place], [place] [address]  Phone [phone] Fax [phone] Note: This document was prepared with digital dictation and possible smart phrase technology. Any transcriptional errors that result from this process are  unintentional.

## 2018-05-05 NOTE — Patient Instructions (Signed)
Continue aspirin 81 mg daily  and atorvastatin (Lipitor) for secondary stroke prevention  Discontinue Plavix at this time and continue on aspirin only  We will check cholesterol levels today to ensure adequate management - we will call you with abnormal levels  If you continue to experience muscle aches or pains, you can try CoQ10 300mg  daily   Complete 30 day cardiac monitor - if negative, may have to consider further testing  Continue to follow up with PCP regarding cholesterol management   Maintain strict control of hypertension with blood pressure goal below 130/90, diabetes with hemoglobin A1c goal below 6.5% and cholesterol with LDL cholesterol (bad cholesterol) goal below 70 mg/dL. I also advised the patient to eat a healthy diet with plenty of whole grains, cereals, fruits and vegetables, exercise regularly and maintain ideal body weight.  Followup in the future with me as needed or call earlier if needed       Thank you for coming to see [place] at [facility]. I hope we have been able to provide you high quality care today.  You may receive a patient satisfaction survey over the next few weeks. We would appreciate your feedback and comments so that we may continue to improve ourselves and the health of our patients.

## 2018-05-06 LAB — LIPID PANEL
CHOLESTEROL TOTAL: 141 mg/dL (ref 100–199)
Chol/HDL Ratio: 3.5 ratio (ref 0.0–5.0)
HDL: 40 mg/dL (ref >39)
LDL Calculated: 85 mg/dL (ref 0–99)
TRIGLYCERIDES: 79 mg/dL (ref 0–149)
VLDL Cholesterol Cal: 16 mg/dL (ref 5–40)

## 2018-12-16 ENCOUNTER — Telehealth: Payer: Self-pay

## 2018-12-16 NOTE — Telephone Encounter (Signed)
Attempted to reach the pt to discuss possibly moving his EMG appt up to 12/21/2018. Pt was unavailable and vm was full.

## 2018-12-21 ENCOUNTER — Ambulatory Visit (INDEPENDENT_AMBULATORY_CARE_PROVIDER_SITE_OTHER): Payer: Medicare Other | Admitting: Neurology

## 2018-12-21 ENCOUNTER — Ambulatory Visit: Payer: Medicare Other | Admitting: Neurology

## 2018-12-21 ENCOUNTER — Other Ambulatory Visit: Payer: Self-pay

## 2018-12-21 ENCOUNTER — Encounter: Payer: Self-pay | Admitting: Neurology

## 2018-12-21 DIAGNOSIS — G609 Hereditary and idiopathic neuropathy, unspecified: Secondary | ICD-10-CM | POA: Diagnosis not present

## 2018-12-21 DIAGNOSIS — G8929 Other chronic pain: Secondary | ICD-10-CM

## 2018-12-21 DIAGNOSIS — M5442 Lumbago with sciatica, left side: Secondary | ICD-10-CM

## 2018-12-21 NOTE — Procedures (Signed)
     HISTORY:  Isaac Hicks is a 73 year old gentleman with a history of a left foot drop.  The patient reports some numbness in the left foot as well, he has been evaluated for possible lumbar radiculopathy.  He is sent for further evaluation of his leg weakness.  NERVE CONDUCTION STUDIES:  Nerve conduction studies were performed on both lower extremities.  The distal motor latencies for the peroneal nerves were normal on the right and absent on the left with a low motor amplitude on the right.  The distal motor latencies for the posterior tibial nerves were normal bilaterally but with low motor amplitudes bilaterally.  Slowing was seen for the right peroneal nerve and for the posterior tibial nerves bilaterally.  The sural and peroneal sensory latencies were normal bilaterally.  The F-wave latencies for the posterior tibial nerves were prolonged bilaterally.  EMG STUDIES:  EMG study was performed on the left lower extremity:  The tibialis anterior muscle reveals no voluntary motor units with no recruitment.  2+ fibrillations and positive waves were seen. The peroneus tertius muscle reveals no voluntary motor units with no recruitment.  2+ fibrillations and positive waves were seen. The medial gastrocnemius muscle reveals 1 to 4K motor units with decreased recruitment.  2+ fibrillations and positive waves were seen. The vastus lateralis muscle reveals 2 to 4K motor units with full recruitment. No fibrillations or positive waves were seen. The iliopsoas muscle reveals 2 to 4K motor units with full recruitment. No fibrillations or positive waves were seen. The biceps femoris muscle (long head) reveals 2 to 5K motor units with decreased recruitment.  2+ positive waves were seen. The gluteus medius muscle reveals 2 to 3K motor units with near normal recruitment.  2+ positive waves were seen. The lumbosacral paraspinal muscles were tested at 3 levels, and revealed no abnormalities of  insertional activity at all 3 levels tested. There was good relaxation.   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  Jill Alexanders MD 12/21/2018 11:33 AM  [facility] [address] [place], [place] [address]  Phone [phone] Fax [phone]

## 2018-12-21 NOTE — Progress Notes (Addendum)
Please refer to EMG and nerve conduction procedure note.      [place]    Nerve / Sites Muscle Latency Ref. Amplitude Ref. Rel Amp Segments Distance Velocity Ref. Area    ms ms mV mV %  cm m/s m/s mVms  R Peroneal - EDB     Ankle EDB 4.7 ?6.5 1.7 ?2.0 100 Ankle - EDB 9   5.9     Fib head EDB 12.9  1.5  85.5 Fib head - Ankle 31 38 ?44 4.4     Pop fossa EDB 15.5  1.5  101 Pop fossa - Fib head 10 38 ?44 4.9         Pop fossa - Ankle      L Peroneal - EDB     Ankle EDB NR ?6.5 NR ?2.0 NR Ankle - EDB 9   NR     Fib head EDB NR  NR  NR Fib head - Ankle 32 NR ?44 NR  R Tibial - AH     Ankle AH 5.3 ?5.8 0.8 ?4.0 100 Ankle - AH 9   3.4     Pop fossa AH 17.3  0.9  110 Pop fossa - Ankle 44 37 ?41 2.4  L Tibial - AH     Ankle AH 5.7 ?5.8 0.7 ?4.0 100 Ankle - AH 9   3.5     Pop fossa AH 18.4  0.7  96.4 Pop fossa - Ankle 42 33 ?41 3.3             SNC    Nerve / Sites Rec. Site Peak Lat Ref.  Amp Ref. Segments Distance    ms ms V V  cm  R Sural - Ankle (Calf)     Calf Ankle 3.4 ?4.4 4 ?6 Calf - Ankle 14  L Sural - Ankle (Calf)     Calf Ankle 3.9 ?4.4 6 ?6 Calf - Ankle 14  R Superficial peroneal - Ankle     Lat leg Ankle 3.8 ?4.4 4 ?6 Lat leg - Ankle 14  L Superficial peroneal - Ankle     Lat leg Ankle 3.9 ?4.4 2 ?6 Lat leg - Ankle 14             F  Wave    Nerve F Lat Ref.   ms ms  R Tibial - AH 58.2 ?56.0  L Tibial - AH 64.9 ?56.0

## 2018-12-21 NOTE — Progress Notes (Signed)
Please refer to EMG and nerve conduction procedure note.  

## 2018-12-29 ENCOUNTER — Encounter: Payer: Medicare Other | Admitting: Neurology

## 2019-03-01 ENCOUNTER — Other Ambulatory Visit: Payer: Self-pay | Admitting: *Deleted

## 2019-03-02 ENCOUNTER — Ambulatory Visit: Payer: Self-pay | Admitting: Orthopedic Surgery

## 2019-03-03 ENCOUNTER — Encounter: Payer: Self-pay | Admitting: Vascular Surgery

## 2019-04-13 ENCOUNTER — Ambulatory Visit (INDEPENDENT_AMBULATORY_CARE_PROVIDER_SITE_OTHER): Payer: Medicare Other | Admitting: Vascular Surgery

## 2019-04-13 ENCOUNTER — Other Ambulatory Visit: Payer: Self-pay

## 2019-04-13 ENCOUNTER — Encounter: Payer: Self-pay | Admitting: Vascular Surgery

## 2019-04-13 VITALS — BP 130/77 | HR 64 | Temp 98.1°F | Resp 20 | Ht 69.0 in | Wt 171.8 lb

## 2019-04-13 DIAGNOSIS — M5136 Other intervertebral disc degeneration, lumbar region: Secondary | ICD-10-CM | POA: Diagnosis not present

## 2019-04-13 NOTE — Progress Notes (Signed)
Vascular and Vein Specialist of [facility]  Patient name: Bravery Ketcham MRN: [id number] DOB: 05-15-1945 Sex: male  REASON FOR CONSULT: Discuss oblique exposure for L4-5 fusion with Dr. Shon Baton  HPI: Codi Kertz is a 74 y.o. male, who is here today for discussion of my role for exposure for L4-5 disc fusion with Dr. Shon Baton.  His daughter is present as well.  He is a very active 74 year old.  He has had progressively severe back pain and lower extremity pain related to degenerative disc disease.  He has seen Dr. Shon Baton in consultation and was recommended an oblique exposure for fusion.  He has had no prior abdominal surgeries.  He has no history of cardiac disease or peripheral vascular occlusive disease.  Past Medical History:  Diagnosis Date  . Back pain   . Cerebrovascular accident (CVA) (HCC)   . GERD (gastroesophageal reflux disease)   . Hypercholesteremia   . Isthmic spondylolisthesis   . Sciatica     Family History  Problem Relation Age of Onset  . Heart disease Brother        CAD    SOCIAL HISTORY: Social History   Socioeconomic History  . Marital status: Married    Spouse name: Not on file  . Number of children: Not on file  . Years of education: Not on file  . Highest education level: Not on file  Occupational History  . Not on file  Tobacco Use  . Smoking status: Former Games developer  . Smokeless tobacco: Never Used  Substance and Sexual Activity  . Alcohol use: No  . Drug use: No  . Sexual activity: Not on file  Other Topics Concern  . Not on file  Social History Narrative  . Not on file   Social Determinants of Health   Financial Resource Strain:   . Difficulty of Paying Living Expenses: Not on file  Food Insecurity:   . Worried About Programme researcher, broadcasting/film/video in the Last Year: Not on file  . Ran Out of Food in the Last Year: Not on file  Transportation Needs:   . Lack of Transportation (Medical): Not on file  .  Lack of Transportation (Non-Medical): Not on file  Physical Activity:   . Days of Exercise per Week: Not on file  . Minutes of Exercise per Session: Not on file  Stress:   . Feeling of Stress : Not on file  Social Connections:   . Frequency of Communication with Friends and Family: Not on file  . Frequency of Social Gatherings with Friends and Family: Not on file  . Attends Religious Services: Not on file  . Active Member of Clubs or Organizations: Not on file  . Attends Banker Meetings: Not on file  . Marital Status: Not on file  Intimate Partner Violence:   . Fear of Current or Ex-Partner: Not on file  . Emotionally Abused: Not on file  . Physically Abused: Not on file  . Sexually Abused: Not on file    No Known Allergies  Current Outpatient Medications  Medication Sig Dispense Refill  . aspirin EC 81 MG EC tablet Take 1 tablet (81 mg total) by mouth daily. 30 tablet 0  . atorvastatin (LIPITOR) 40 MG tablet Take 1 tablet (40 mg total) by mouth daily at 6 PM. 90 tablet 3  . gabapentin (NEURONTIN) 300 MG capsule Take 300 mg by mouth at bedtime.     Marland Kitchen KRILL OIL PO Take 1 capsule by mouth at  bedtime.    . Multiple Vitamin (MULTIVITAMIN WITH MINERALS) TABS tablet Take 1 tablet by mouth daily.    . saw palmetto 160 MG capsule Take 160 mg by mouth daily.      No current facility-administered medications for this visit.    REVIEW OF SYSTEMS:  [X]  denotes positive finding, [ ]  denotes negative finding Cardiac  Comments:  Chest pain or chest pressure:    Shortness of breath upon exertion:    Short of breath when lying flat:    Irregular heart rhythm:        Vascular    Pain in calf, thigh, or hip brought on by ambulation:    Pain in feet at night that wakes you up from your sleep:     Blood clot in your veins:    Leg swelling:         Pulmonary    Oxygen at home:    Productive cough:     Wheezing:         Neurologic    Sudden weakness in arms or legs:       Sudden numbness in arms or legs:     Sudden onset of difficulty speaking or slurred speech:    Temporary loss of vision in one eye:     Problems with dizziness:         Gastrointestinal    Blood in stool:     Vomited blood:         Genitourinary    Burning when urinating:     Blood in urine:        Psychiatric    Major depression:         Hematologic    Bleeding problems:    Problems with blood clotting too easily:        Skin    Rashes or ulcers:        Constitutional    Fever or chills:      PHYSICAL EXAM: Vitals:   04/13/19 1402  BP: 130/77  Pulse: 64  Resp: 20  Temp: 98.1 F (36.7 C)  SpO2: 99%  Weight: 171 lb 12.8 oz (77.9 kg)  Height: 5\' 9"  (1.753 m)    GENERAL: The patient is a well-nourished male, in no acute distress. The vital signs are documented above. CARDIOVASCULAR: Carotid arteries without bruits bilaterally.  2+ radial 2+ femoral 2+ popliteal pulses bilaterally. PULMONARY: There is good air exchange  ABDOMEN: Soft and non-tender  MUSCULOSKELETAL: There are no major deformities or cyanosis. NEUROLOGIC: No focal weakness or paresthesias are detected. SKIN: There are no ulcers or rashes noted. PSYCHIATRIC: The patient has a normal affect.  DATA:  I have MRI from 2018 for review.  This shows normal location of aortic bifurcation.  I do not have any plain films for evaluation of aortoiliac calcification  MEDICAL ISSUES: Had long discussion with the patient and his daughter regarding my role for exposure.  Explained that Dr. 04/15/19 is determined that surgery is his best option for relief of his symptoms and that an oblique approach is the most appropriate.  Explained mobilization of the structures overlying the spine particular emphasis on arterial and venous structures and potential injury for these.  I do not feel that he has any prohibitive risk factors.  He is not obese, he has never had intra-abdominal surgery and has normal arterial flow distally  with no evidence of peripheral vascular disease.  I did explain the current temporary hold on elective cases  and that Dr. Rolena Infante would determine the timing of his surgery.   Rosetta Posner, MD FACS Vascular and Vein Specialists of [facility] Tel [phone] Pager [phone]

## 2019-04-19 ENCOUNTER — Ambulatory Visit: Payer: Self-pay | Admitting: Orthopedic Surgery

## 2019-04-19 NOTE — H&P (Signed)
Subjective:   Isaac Hicks is a pleasant 74 year old male Past medical history significant for previous stroke, former smoker who has been experiencing low back pain with Left lower extremity pain and weakness. He would like to move forward with surgical intervention. He is scheduled for OLIF L4-5 w/PSIF on 04/29/19 at [facility].  Patient Active Problem List   Diagnosis Date Noted  . GERD (gastroesophageal reflux disease)   . Back pain   . TIA (transient ischemic attack) 03/25/2018   Past Medical History:  Diagnosis Date  . Back pain   . Cerebrovascular accident (CVA) (HCC)   . GERD (gastroesophageal reflux disease)   . Hypercholesteremia   . Isthmic spondylolisthesis   . Sciatica     No past surgical history on file.  Current Outpatient Medications  Medication Sig Dispense Refill Last Dose  . aspirin EC 81 MG EC tablet Take 1 tablet (81 mg total) by mouth daily. 30 tablet 0   . atorvastatin (LIPITOR) 40 MG tablet Take 1 tablet (40 mg total) by mouth daily at 6 PM. 90 tablet 3   . gabapentin (NEURONTIN) 300 MG capsule Take 300 mg by mouth at bedtime.      Marland Kitchen KRILL OIL PO Take 1 capsule by mouth at bedtime.     . Multiple Vitamin (MULTIVITAMIN WITH MINERALS) TABS tablet Take 1 tablet by mouth daily.     . saw palmetto 160 MG capsule Take 160 mg by mouth daily.       No current facility-administered medications for this visit.   No Known Allergies  Social History   Tobacco Use  . Smoking status: Former Games developer  . Smokeless tobacco: Never Used  Substance Use Topics  . Alcohol use: No    Family History  Problem Relation Age of Onset  . Heart disease Brother        CAD    Review of Systems I stated in HPI  Objective:   Vitals: Ht: 5 ft 8.5 in 04/19/2019 10:11 am Wt: 173 lbs 04/19/2019 10:19 am BMI: 25.9 04/19/2019 10:19 am BP: 145/97 04/19/2019 10:21 am Pulse: 57 bpm 04/19/2019 10:21 am T: 98.1 F 04/19/2019 10:21 am Pain Scale: 8 04/19/2019 10:20 am Notes: "I don't have  pain sitting here" 04/19/2019   Clinical exam: [name] is a pleasant individual, who appears younger than their stated age. He Is alert and orientated 3. No shortness of breath, chest pain.  Heart: Regular rate and rhythm, no rubs, murmurs, or gallops.  Lungs: Clear to auscultation bilaterally  Abdomen is soft and non-tender, negative loss of bowel and bladder control, no rebound tenderness. Bowel sounds 4  Negative: skin lesions abrasions contusions  Peripheral pulses: 2+ dorsalis pedis/posterior tibialis pulses bilaterally. Compartment soft and nontender.  Gait pattern: Altered gait pattern due to chronic left foot drop  Assistive devices: Cane  Neuro: 2/5 EHL, tibialis anterior, gastrocnemius strength on the left side. Remainder of the motor exam is 5/5. Negative nerve root tension signs in the lower extremity. Decreased sensation to light touch throughout in the left calf and foot and ankle region. 2+ deep tendon reflexes on the right side at the knee and Achilles. Absent on the left side. No clonus, negative Babinski test.  Musculoskeletal: Moderate low back pain with palpation and range of motion primarily in the gluteal region. No hip/groin pain, knee pain, ankle pain with isolated joint range of motion.  X-rays of the lumbar spine demonstrated degenerative scoliosis of the lumbar spine. There is a grade 1 borderline 2  spondylolisthesis at L4-5 with significant foraminal compromise. Advanced degenerative disc disease also at L4-5.  Of the lumbar spine completed on 11/18/18 was reviewed. I do agree with the radiology report. He has severe disc bulge osteophyte complex at L3-4 with severe disc height loss. Mild central stenosis and mild narrowing of the lateral recess. Moderate foraminal stenosis is noted. Patient has a degenerative grade 2 anterolisthesis of L4 and 5 producing severe central and lateral recess stenosis. He has moderate left foraminal stenosis and moderate to severe  right foraminal stenosis.   Assessment:   He continues to have significant back buttock and radicular left leg pain. He denies any symptoms in the right lower extremity. His clinical exam is significant for the chronic left foot drop. Imaging studies do show slight degenerative scoliosis apex at L3-4 with significant collapse of the L 3/4 disc. Based on his MRI he has increased right lateral recess and foraminal stenosis at L3-4. At L4-5 he is got a degenerative spondylolisthesis grade 2 with severe spinal stenosis and severe bilateral lateral recess stenosis. Moderate foraminal stenosis. His nerve conduction test demonstrates chronic peroneal nerve denervation as well as the line L5 radiculopathy. Based on his clinical exam, imaging studies, and EMG I believe his primary source of pathology is the L4-5 level. To address the neural compression and neuropathic pain as well as the structural instability I am recommending an oblique lumbar interbody fusion. This will restore the disc space and indirectly decompress the lateral recess and the foramen. Although he does have degenerative disease at the L3-4 level he is not having any significant right radicular leg pain and so I do not think it is currently part of his pain generator. If had a long talk about surgery including the risks and benefits. I did indicate that with the indirect decompression that the interbody fixation provides he may still have residual nerve irritation that could necessitate an open posterior decompression in the future. But given the structural abnormality I think the best option is the oblique interbody fusion which will result in indirect decompression to address the neuropathic pain. I would then supplement the interbody fixation with posterior pedicle screw fixation.    With respect to his chronic foot drop I did tell him that this more than likely will not improve with the surgery. The goal of surgery is to decrease not  eliminate his severe back buttock and neuropathic left leg pain. He may still have the foot drop as well as the numbness and dysesthesias in the calf and foot due to the peroneal neuropathy. Patient indicates that he understands that and that was the same message that was given to him by the neurologist who performed the EMG test.    Plan:    Risks and benefits of surgery were discussed with the patient. These include: Infection, bleeding, death, stroke, paralysis, ongoing or worse pain, need for additional surgery, nonunion, leak of spinal fluid, adjacent segment degeneration requiring additional fusion surgery, need for posterior decompression and/or fusion. Bleeding from major vessels, and blood clots (deep venous thrombosis)requiring additional treatment, loss in bowel and bladder control, injury to bladder, ureters, and other major abdominal organs.  Risks and benefits of spinal fusion: Infection, bleeding, death, stroke, paralysis, ongoing or worse pain, need for additional surgery, nonunion, leak of spinal fluid, adjacent segment degeneration requiring additional fusion surgery, Injury to abdominal vessels that can require anterior surgery to stop bleeding. Malposition of the cage and/or pedicle screws that could require additional surgery. Loss of  bowel and bladder control. Postoperative hematoma causing neurologic compression that could require urgent or emergent re-operation.  We have obtained preoperative medical clearance from the patient's primary care provider.  I reviewed the patient's medication list with him. He is not on any blood thinners, although he does take aspirin. He has discontinued all medications at this point. I told him that he may take the gabapentin as well as Tylenol as needed for pain leading up to surgery.  We have also discussed the post-operative recovery period to include: bathing/showering restrictions, wound healing, activity (and driving) restrictions,  medications/pain mangement.  We have also discussed post-operative redflags to include: signs and symptoms of postoperative infection, DVT/PE.  Patient will be fitted for LSO brace with physical therapy today.  Plan is to move forward with surgical intervention as planned pending preoperative testing at [facility].   Follow-up: 2 weeks postoperatively

## 2019-04-26 ENCOUNTER — Other Ambulatory Visit (HOSPITAL_COMMUNITY)
Admission: RE | Admit: 2019-04-26 | Discharge: 2019-04-26 | Disposition: A | Discharge status: Home / Self Care | Payer: Medicare Other | Source: Ambulatory Visit | Attending: Orthopedic Surgery | Admitting: Orthopedic Surgery

## 2019-04-26 ENCOUNTER — Encounter (HOSPITAL_COMMUNITY)
Admission: RE | Admit: 2019-04-26 | Discharge: 2019-04-26 | Disposition: A | Discharge status: Home / Self Care | Payer: Medicare Other | Source: Ambulatory Visit | Attending: Orthopedic Surgery | Admitting: Orthopedic Surgery

## 2019-04-26 ENCOUNTER — Other Ambulatory Visit: Payer: Self-pay

## 2019-04-26 ENCOUNTER — Ambulatory Visit (HOSPITAL_COMMUNITY)
Admission: RE | Admit: 2019-04-26 | Discharge: 2019-04-26 | Disposition: A | Discharge status: Home / Self Care | Payer: Medicare Other | Source: Ambulatory Visit | Attending: Orthopedic Surgery | Admitting: Orthopedic Surgery

## 2019-04-26 ENCOUNTER — Encounter (HOSPITAL_COMMUNITY): Payer: Self-pay

## 2019-04-26 DIAGNOSIS — Z20822 Contact with and (suspected) exposure to covid-19: Secondary | ICD-10-CM | POA: Insufficient documentation

## 2019-04-26 DIAGNOSIS — Z01818 Encounter for other preprocedural examination: Secondary | ICD-10-CM

## 2019-04-26 HISTORY — DX: Bradycardia, unspecified: R00.1

## 2019-04-26 HISTORY — DX: Post-traumatic stress disorder, unspecified: F43.10

## 2019-04-26 HISTORY — DX: Benign prostatic hyperplasia without lower urinary tract symptoms: N40.0

## 2019-04-26 LAB — TYPE AND SCREEN
ABO/RH(D): O POS
Antibody Screen: NEGATIVE

## 2019-04-26 LAB — CBC
HCT: 45.5 % (ref 39.0–52.0)
Hemoglobin: 15.4 g/dL (ref 13.0–17.0)
MCH: 30.5 pg (ref 26.0–34.0)
MCHC: 33.8 g/dL (ref 30.0–36.0)
MCV: 90.1 fL (ref 80.0–100.0)
Platelets: 170 10*3/uL (ref 150–400)
RBC: 5.05 MIL/uL (ref 4.22–5.81)
RDW: 12.3 % (ref 11.5–15.5)
WBC: 7.2 10*3/uL (ref 4.0–10.5)
nRBC: 0 % (ref 0.0–0.2)

## 2019-04-26 LAB — SURGICAL PCR SCREEN
MRSA, PCR: NEGATIVE
Staphylococcus aureus: POSITIVE — AB

## 2019-04-26 LAB — ABO/RH: ABO/RH(D): O POS

## 2019-04-26 LAB — URINALYSIS, ROUTINE W REFLEX MICROSCOPIC
Bilirubin Urine: NEGATIVE
Glucose, UA: NEGATIVE mg/dL
Hgb urine dipstick: NEGATIVE
Ketones, ur: NEGATIVE mg/dL
Leukocytes,Ua: NEGATIVE
Nitrite: NEGATIVE
Protein, ur: NEGATIVE mg/dL
Specific Gravity, Urine: 1.004 — ABNORMAL LOW (ref 1.005–1.030)
pH: 6 (ref 5.0–8.0)

## 2019-04-26 LAB — BASIC METABOLIC PANEL
Anion gap: 9 (ref 5–15)
BUN: 9 mg/dL (ref 8–23)
CO2: 28 mmol/L (ref 22–32)
Calcium: 9.6 mg/dL (ref 8.9–10.3)
Chloride: 105 mmol/L (ref 98–111)
Creatinine, Ser: 1.02 mg/dL (ref 0.61–1.24)
GFR calc Af Amer: >60 mL/min (ref >60)
GFR calc non Af Amer: >60 mL/min (ref >60)
Glucose, Bld: 90 mg/dL (ref 70–99)
Potassium: 3.9 mmol/L (ref 3.5–5.1)
Sodium: 142 mmol/L (ref 135–145)

## 2019-04-26 LAB — PROTIME-INR
INR: 1 (ref 0.8–1.2)
Prothrombin Time: 12.9 seconds (ref 11.4–15.2)

## 2019-04-26 LAB — SARS CORONAVIRUS 2 (TAT 6-24 HRS): SARS Coronavirus 2: NEGATIVE

## 2019-04-26 LAB — APTT: aPTT: 32 seconds (ref 24–36)

## 2019-04-26 NOTE — Pre-Procedure Instructions (Signed)
Lacey Franze  04/26/2019      [id number] - [place], [place] - [address] [place] [address] Phone: [phone] Fax: [phone]    Your procedure is scheduled on 04/29/19.  Report to [facility] Admitting at 530 A.M.  Call this number if you have problems the morning of surgery:  [phone]   Remember:  Do not eat or drink after midnight.  Y   Take these medicines the morning of surgery with A SIP OF WATER --NEURONTIN    Do not wear jewelry, make-up or nail polish.  Do not wear lotions, powders, or perfumes, or deodorant.  Do not shave 48 hours prior to surgery.  Men may shave face and neck.  Do not bring valuables to the hospital.  [facility] is not responsible for any belongings or valuables.  Contacts, dentures or bridgework may not be worn into surgery.  Leave your suitcase in the car.  After surgery it may be brought to your room.  For patients admitted to the hospital, discharge time will be determined by your treatment team.  Patients discharged the day of surgery will not be allowed to drive home.    Special instructions:  Do not take any aspirin,anti-inflammatories,vitamins,or herbal supplements 5-7 days prior to surgery. [facility] - Preparing for Surgery  Before surgery, you can play an important role.  Because skin is not sterile, your skin needs to be as free of germs as possible.  You can reduce the number of germs on you skin by washing with CHG (chlorahexidine gluconate) soap before surgery.  CHG is an antiseptic cleaner which kills germs and bonds with the skin to continue killing germs even after washing.  Oral Hygiene is also important in reducing the risk of infection.  Remember to brush your teeth with your regular toothpaste the morning of surgery.  Please DO NOT use if you have an allergy to CHG or antibacterial soaps.  If your skin becomes reddened/irritated stop using the CHG and inform your nurse  when you arrive at Short Stay.  Do not shave (including legs and underarms) for at least 48 hours prior to the first CHG shower.  You may shave your face.  Please follow these instructions carefully:   1.  Shower with CHG Soap the night before surgery and the morning of Surgery.  2.  If you choose to wash your hair, wash your hair first as usual with your normal shampoo.  3.  After you shampoo, rinse your hair and body thoroughly to remove the shampoo. 4.  Use CHG as you would any other liquid soap.  You can apply chg directly to the skin and wash gently with a      scrungie or washcloth.           5.  Apply the CHG Soap to your body ONLY FROM THE NECK DOWN.   Do not use on open wounds or open sores. Avoid contact with your eyes, ears, mouth and genitals (private parts).  Wash genitals (private parts) with your normal soap.  6.  Wash thoroughly, paying special attention to the area where your surgery will be performed.  7.  Thoroughly rinse your body with warm water from the neck down.  8.  DO NOT shower/wash with your normal soap after using and rinsing off the CHG Soap.  9.  Pat yourself dry with a clean towel.  10.  Wear clean pajamas.            11.  Place clean sheets on your bed the night of your first shower and do not sleep with pets.  Day of Surgery  Do not apply any lotions/deoderants the morning of surgery.   Please wear clean clothes to the hospital/surgery center. Remember to brush your teeth with toothpaste.   Please read over the following fact sheets that you were given. MRSA Information

## 2019-04-26 NOTE — Progress Notes (Signed)
PCP - DR Fur Cardiologist - na     Chest x-ray -today  EKG - 2/08 Stress Test - 1/20 ECHO -1/20  Cardiac Cath - na           s: Aspirin Instructions:stop        COVID TEST- today   Anesthesia review: hx brady   Dr Sandria Senter aware of surgery  Patient denies shortness of breath, fever, cough and chest pain at PAT appointment   All instructions explained to the patient, with a verbal understanding of the material. Patient agrees to go over the instructions while at home for a better understanding. Patient also instructed to self quarantine after being tested for COVID-19. The opportunity to ask questions was provided.

## 2019-04-27 NOTE — Progress Notes (Signed)
Anesthesia Chart Review:  Follows with neurology for hx of Right MCA infarct 03/25/2018.Per notes, no sequelae from event.  He presented with transientslurred speech and imbalance.  CT head negative for acute infarct.  MRI brain showed small right SCA infarcts.  MRA, Carotid US and 2D echo unremarkable. Outpatient 30 day monitor ordered by neurology did not show any significant arrhythmias.   PCP clearance dated 02/03/19, copy on chart.  Preop labs reviewed, unremarkable.  EKG 04/26/19: Normal sinus rhythm. Rate 62. Left axis deviation. No significant change since last tracing  30d event monitor 04/09/18: Sinus rhythm Nocturnal bradycardia is noted No atrial fibrillation No AV block or pauses No sustained arrhythmias  CT Chest 04/03/18 (care everywhere): IMPRESSION: 1. No abnormal findings to explain recent echo cardiographic findings. 2. Ascending Aortic aneurysm NOS (ICD10-I71.9). Recommend annual imaging followup by CTA or MRA. This recommendation follows 2010 ACCF/AHA/AATS/ACR/ASA/SCA/SCAI/SIR/STS/SVM Guidelines for the Diagnosis and Management of Patients with Thoracic Aortic Disease. Circulation. 2010; 121: A630-Z601. Aortic aneurysm NOS (ICD10-I71.9). 3. Possible gallstone. 4. Aortic atherosclerosis (ICD10-170.0). Coronary artery calcification.  Echo 03/26/18: - Left ventricle: The cavity size was normal. There was mild focal  basal hypertrophy of the septum. Systolic function was normal.  The estimated ejection fraction was in the range of 60% to 65%.  Wall motion was normal; there were no regional wall motion  abnormalities. Left ventricular diastolic function parameters  were normal.  - Mitral valve: There was mild regurgitation. Valve area by  pressure half-time: 2.29 cm^2.  - Atrial septum: No defect or patent foramen ovale was identified.  - Impressions: Retro cardiac structure ? aorta vs esophagus can  consider CT chest to further evaluate.   Zannie Cove [facility] Short Stay Center/Anesthesiology Phone [phone] 04/27/2019 12:16 PM

## 2019-04-27 NOTE — Anesthesia Preprocedure Evaluation (Addendum)
Anesthesia Evaluation  Patient identified by MRN, date of birth, ID band Patient awake    Reviewed: Allergy & Precautions, NPO status , Patient's Chart, lab work & pertinent test results  Airway Mallampati: II  TM Distance: >3 FB Neck ROM: Full    Dental no notable dental hx.    Pulmonary neg pulmonary ROS, former smoker,    Pulmonary exam normal breath sounds clear to auscultation       Cardiovascular Normal cardiovascular exam Rhythm:Regular Rate:Normal  EKG 04/26/19: Normal sinus rhythm. Rate 62. Left axis deviation. No significant change since last tracing  30d event monitor 04/09/18: Sinus rhythm Nocturnal bradycardia is noted No atrial fibrillation No AV block or pauses No sustained arrhythmias  Echo 03/26/18: - Left ventricle: The cavity size was normal. There was mild focalbasal hypertrophy of the septum. Systolic function was normal.The estimated ejection fraction was in the range of 60% to 65%.Wall motion was normal; there were no regional wall motion abnormalities. Left ventricular diastolic function parameterswere normal.  - Mitral valve: There was mild regurgitation. Valve area by  pressure half-time: 2.29 cm^2.  - Atrial septum: No defect or patent foramen ovale was identified.    CT Chest 03/2018: Ascending aorta aneurysm measures approximately 4.3 cm   Neuro/Psych PSYCHIATRIC DISORDERS Anxiety CVA in 2000 and 03/2018  [facility] 03/2018: 1. 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 2. Negative intracranial MRA. TIACVA, No Residual Symptoms    GI/Hepatic Neg liver ROS, GERD  Controlled,  Endo/Other  negative endocrine ROS  Renal/GU negative Renal ROS  negative genitourinary   Musculoskeletal  (+) Arthritis , Osteoarthritis,  Sciatica, chronic LBP, spondylolisthesis with severe stenosis L4-5 with L5 radiculopathy   Abdominal   Peds negative pediatric ROS (+)  Hematology negative hematology  ROS (+)   Anesthesia Other Findings HLD  Reproductive/Obstetrics negative OB ROS                            Anesthesia Physical Anesthesia Plan  ASA: II  Anesthesia Plan: General and Regional   Post-op Pain Management: GA combined w/ Regional for post-op pain   Induction: Intravenous  PONV Risk Score and Plan: 4 or greater and Ondansetron, Dexamethasone and Treatment may vary due to age or medical condition  Airway Management Planned: Oral ETT  Additional Equipment: None  Intra-op Plan:   Post-operative Plan: Extubation in OR  Informed Consent: I have reviewed the patients History and Physical, chart, labs and discussed the procedure including the risks, benefits and alternatives for the proposed anesthesia with the patient or authorized representative who has indicated his/her understanding and acceptance.     Dental advisory given  Plan Discussed with: CRNA  Anesthesia Plan Comments: ( )       Anesthesia Quick Evaluation

## 2019-04-29 ENCOUNTER — Encounter (HOSPITAL_COMMUNITY): Payer: Self-pay | Admitting: Orthopedic Surgery

## 2019-04-29 ENCOUNTER — Inpatient Hospital Stay (HOSPITAL_COMMUNITY): Payer: Medicare Other | Admitting: Physician Assistant

## 2019-04-29 ENCOUNTER — Inpatient Hospital Stay (HOSPITAL_COMMUNITY): Payer: Medicare Other | Admitting: Certified Registered"

## 2019-04-29 ENCOUNTER — Other Ambulatory Visit: Payer: Self-pay

## 2019-04-29 ENCOUNTER — Inpatient Hospital Stay (HOSPITAL_COMMUNITY)
Admission: RE | Admit: 2019-04-29 | Discharge: 2019-05-01 | DRG: 460 | Disposition: A | Discharge status: Home / Self Care | Payer: Medicare Other | Attending: Orthopedic Surgery | Admitting: Orthopedic Surgery

## 2019-04-29 ENCOUNTER — Inpatient Hospital Stay (HOSPITAL_COMMUNITY): Payer: Medicare Other

## 2019-04-29 ENCOUNTER — Encounter (HOSPITAL_COMMUNITY)
Admission: RE | Disposition: A | Discharge status: Home / Self Care | Payer: Self-pay | Source: Home / Self Care | Attending: Orthopedic Surgery

## 2019-04-29 DIAGNOSIS — Z87891 Personal history of nicotine dependence: Secondary | ICD-10-CM | POA: Diagnosis not present

## 2019-04-29 DIAGNOSIS — F419 Anxiety disorder, unspecified: Secondary | ICD-10-CM | POA: Diagnosis present

## 2019-04-29 DIAGNOSIS — N400 Enlarged prostate without lower urinary tract symptoms: Secondary | ICD-10-CM | POA: Diagnosis present

## 2019-04-29 DIAGNOSIS — Z20822 Contact with and (suspected) exposure to covid-19: Secondary | ICD-10-CM | POA: Diagnosis present

## 2019-04-29 DIAGNOSIS — M5416 Radiculopathy, lumbar region: Secondary | ICD-10-CM | POA: Diagnosis present

## 2019-04-29 DIAGNOSIS — Z419 Encounter for procedure for purposes other than remedying health state, unspecified: Secondary | ICD-10-CM

## 2019-04-29 DIAGNOSIS — M21372 Foot drop, left foot: Secondary | ICD-10-CM | POA: Diagnosis present

## 2019-04-29 DIAGNOSIS — K219 Gastro-esophageal reflux disease without esophagitis: Secondary | ICD-10-CM | POA: Diagnosis present

## 2019-04-29 DIAGNOSIS — M4316 Spondylolisthesis, lumbar region: Principal | ICD-10-CM | POA: Diagnosis present

## 2019-04-29 DIAGNOSIS — F4310 Post-traumatic stress disorder, unspecified: Secondary | ICD-10-CM | POA: Diagnosis present

## 2019-04-29 DIAGNOSIS — M48061 Spinal stenosis, lumbar region without neurogenic claudication: Secondary | ICD-10-CM | POA: Diagnosis present

## 2019-04-29 DIAGNOSIS — Z8673 Personal history of transient ischemic attack (TIA), and cerebral infarction without residual deficits: Secondary | ICD-10-CM | POA: Diagnosis not present

## 2019-04-29 DIAGNOSIS — Z981 Arthrodesis status: Secondary | ICD-10-CM

## 2019-04-29 HISTORY — PX: ANTERIOR LUMBAR FUSION: SHX1170

## 2019-04-29 HISTORY — PX: ABDOMINAL EXPOSURE: SHX5708

## 2019-04-29 SURGERY — ANTERIOR LUMBAR FUSION 1 LEVEL
Anesthesia: Regional

## 2019-04-29 MED ORDER — PROPOFOL 500 MG/50ML IV EMUL
INTRAVENOUS | Status: DC | PRN
  Administered 2019-04-29: 50 ug/kg/min via INTRAVENOUS

## 2019-04-29 MED ORDER — ONDANSETRON HCL 4 MG/2ML IJ SOLN: 4.0000 mg | Freq: Once | INTRAMUSCULAR | Status: DC | PRN

## 2019-04-29 MED ORDER — OXYCODONE-ACETAMINOPHEN 10-325 MG PO TABS: 1.0000 | ORAL_TABLET | Freq: Four times a day (QID) | ORAL | 0 refills | Status: DC | PRN

## 2019-04-29 MED ORDER — LACTATED RINGERS IV SOLN: INTRAVENOUS | Status: DC

## 2019-04-29 MED ORDER — HEMOSTATIC AGENTS (NO CHARGE) OPTIME
TOPICAL | Status: DC | PRN
  Administered 2019-04-29: 1 via TOPICAL

## 2019-04-29 MED ORDER — KETOROLAC TROMETHAMINE 30 MG/ML IJ SOLN
INTRAMUSCULAR | Status: AC
  Filled 2019-04-29: qty 1

## 2019-04-29 MED ORDER — PROPOFOL 10 MG/ML IV BOLUS
INTRAVENOUS | Status: AC
  Filled 2019-04-29: qty 20

## 2019-04-29 MED ORDER — MENTHOL 3 MG MT LOZG: 1.0000 | LOZENGE | OROMUCOSAL | Status: DC | PRN

## 2019-04-29 MED ORDER — SODIUM CHLORIDE 0.9 % IV SOLN: 250.0000 mL | INTRAVENOUS | Status: DC

## 2019-04-29 MED ORDER — CEFAZOLIN SODIUM-DEXTROSE 2-4 GM/100ML-% IV SOLN
2.0000 g | INTRAVENOUS | Status: AC
  Administered 2019-04-29 (×2): 2 g via INTRAVENOUS
  Filled 2019-04-29: qty 100

## 2019-04-29 MED ORDER — ACETAMINOPHEN 325 MG PO TABS
650.0000 mg | ORAL_TABLET | ORAL | Status: DC | PRN
  Administered 2019-04-30: 20:00:00 650 mg via ORAL
  Filled 2019-04-29: qty 2

## 2019-04-29 MED ORDER — HYDROMORPHONE HCL 1 MG/ML IJ SOLN
INTRAMUSCULAR | Status: AC
  Filled 2019-04-29: qty 1

## 2019-04-29 MED ORDER — LIDOCAINE 2% (20 MG/ML) 5 ML SYRINGE
INTRAMUSCULAR | Status: AC
  Filled 2019-04-29: qty 5

## 2019-04-29 MED ORDER — BUPIVACAINE HCL 0.25 % IJ SOLN
INTRAMUSCULAR | Status: DC | PRN
  Administered 2019-04-29: 18 mL

## 2019-04-29 MED ORDER — BUPIVACAINE HCL (PF) 0.25 % IJ SOLN
INTRAMUSCULAR | Status: AC
  Filled 2019-04-29: qty 30

## 2019-04-29 MED ORDER — ROPIVACAINE HCL 5 MG/ML IJ SOLN
INTRAMUSCULAR | Status: DC | PRN
  Administered 2019-04-29: 20 mL via PERINEURAL

## 2019-04-29 MED ORDER — OXYCODONE HCL 5 MG/5ML PO SOLN: 5.0000 mg | Freq: Once | ORAL | Status: DC | PRN

## 2019-04-29 MED ORDER — HEPARIN SODIUM (PORCINE) 1000 UNIT/ML IJ SOLN
INTRAMUSCULAR | Status: DC | PRN
  Administered 2019-04-29: 5000 [IU] via INTRAVENOUS

## 2019-04-29 MED ORDER — SODIUM CHLORIDE 0.9% FLUSH
3.0000 mL | Freq: Two times a day (BID) | INTRAVENOUS | Status: DC
  Administered 2019-04-29: 21:00:00 3 mL via INTRAVENOUS

## 2019-04-29 MED ORDER — THROMBIN 5000 UNITS EX SOLR
CUTANEOUS | Status: DC | PRN
  Administered 2019-04-29: 5000 [IU] via TOPICAL

## 2019-04-29 MED ORDER — METHOCARBAMOL 500 MG PO TABS
500.0000 mg | ORAL_TABLET | Freq: Four times a day (QID) | ORAL | Status: DC | PRN
  Administered 2019-04-30 – 2019-05-01 (×3): 500 mg via ORAL
  Filled 2019-04-29 (×3): qty 1

## 2019-04-29 MED ORDER — PHENOL 1.4 % MT LIQD: 1.0000 | OROMUCOSAL | Status: DC | PRN

## 2019-04-29 MED ORDER — MIDAZOLAM HCL 2 MG/2ML IJ SOLN
INTRAMUSCULAR | Status: AC
  Filled 2019-04-29: qty 2

## 2019-04-29 MED ORDER — BUPIVACAINE LIPOSOME 1.3 % IJ SUSP
INTRAMUSCULAR | Status: DC | PRN
  Administered 2019-04-29: 10 mL via PERINEURAL

## 2019-04-29 MED ORDER — OXYCODONE HCL 5 MG PO TABS
5.0000 mg | ORAL_TABLET | ORAL | Status: DC | PRN
  Administered 2019-04-30: 18:00:00 5 mg via ORAL
  Filled 2019-04-29: qty 1

## 2019-04-29 MED ORDER — SUGAMMADEX SODIUM 200 MG/2ML IV SOLN
INTRAVENOUS | Status: DC | PRN
  Administered 2019-04-29: 50 mg via INTRAVENOUS

## 2019-04-29 MED ORDER — EPINEPHRINE 1 MG/10ML IJ SOSY
PREFILLED_SYRINGE | INTRAMUSCULAR | Status: DC | PRN
  Administered 2019-04-29: .15 mg

## 2019-04-29 MED ORDER — EPHEDRINE 5 MG/ML INJ
INTRAVENOUS | Status: AC
  Filled 2019-04-29: qty 10

## 2019-04-29 MED ORDER — METHOCARBAMOL 500 MG PO TABS: 500.0000 mg | ORAL_TABLET | Freq: Three times a day (TID) | ORAL | 0 refills | Status: DC | PRN

## 2019-04-29 MED ORDER — ACETAMINOPHEN 650 MG RE SUPP: 650.0000 mg | RECTAL | Status: DC | PRN

## 2019-04-29 MED ORDER — DEXAMETHASONE SODIUM PHOSPHATE 10 MG/ML IJ SOLN
INTRAMUSCULAR | Status: DC | PRN
  Administered 2019-04-29: 10 mg via INTRAVENOUS

## 2019-04-29 MED ORDER — HEPARIN SOD (PORK) LOCK FLUSH 100 UNIT/ML IV SOLN
INTRAVENOUS | Status: AC
  Filled 2019-04-29: qty 5

## 2019-04-29 MED ORDER — GABAPENTIN 300 MG PO CAPS
300.0000 mg | ORAL_CAPSULE | Freq: Three times a day (TID) | ORAL | Status: DC
  Administered 2019-04-29 – 2019-05-01 (×6): 300 mg via ORAL
  Filled 2019-04-29 (×6): qty 1

## 2019-04-29 MED ORDER — CHLORHEXIDINE GLUCONATE 4 % EX LIQD: 60.0000 mL | Freq: Once | CUTANEOUS | Status: DC

## 2019-04-29 MED ORDER — OXYCODONE HCL 5 MG PO TABS: 5.0000 mg | ORAL_TABLET | Freq: Once | ORAL | Status: DC | PRN

## 2019-04-29 MED ORDER — METHOCARBAMOL 1000 MG/10ML IJ SOLN
500.0000 mg | Freq: Four times a day (QID) | INTRAVENOUS | Status: DC | PRN
  Filled 2019-04-29: qty 5

## 2019-04-29 MED ORDER — THROMBIN 5000 UNITS EX SOLR
CUTANEOUS | Status: AC
  Filled 2019-04-29: qty 5000

## 2019-04-29 MED ORDER — ONDANSETRON HCL 4 MG PO TABS: 4.0000 mg | ORAL_TABLET | Freq: Four times a day (QID) | ORAL | Status: DC | PRN

## 2019-04-29 MED ORDER — EPHEDRINE SULFATE-NACL 50-0.9 MG/10ML-% IV SOSY
PREFILLED_SYRINGE | INTRAVENOUS | Status: DC | PRN
  Administered 2019-04-29: 15 mg via INTRAVENOUS
  Administered 2019-04-29 (×2): 10 mg via INTRAVENOUS

## 2019-04-29 MED ORDER — OXYCODONE HCL 5 MG PO TABS
10.0000 mg | ORAL_TABLET | ORAL | Status: DC | PRN
  Administered 2019-04-30 – 2019-05-01 (×3): 10 mg via ORAL
  Filled 2019-04-29 (×3): qty 2

## 2019-04-29 MED ORDER — ONDANSETRON HCL 4 MG/2ML IJ SOLN
4.0000 mg | Freq: Four times a day (QID) | INTRAMUSCULAR | Status: DC | PRN
  Administered 2019-04-29: 4 mg via INTRAVENOUS
  Filled 2019-04-29: qty 2

## 2019-04-29 MED ORDER — KETOROLAC TROMETHAMINE 30 MG/ML IJ SOLN
15.0000 mg | Freq: Once | INTRAMUSCULAR | Status: AC | PRN
  Administered 2019-04-29: 13:00:00 15 mg via INTRAVENOUS

## 2019-04-29 MED ORDER — CEFAZOLIN SODIUM-DEXTROSE 1-4 GM/50ML-% IV SOLN
1.0000 g | Freq: Three times a day (TID) | INTRAVENOUS | Status: AC
  Administered 2019-04-29 – 2019-04-30 (×2): 1 g via INTRAVENOUS
  Filled 2019-04-29 (×2): qty 50

## 2019-04-29 MED ORDER — ROCURONIUM BROMIDE 10 MG/ML (PF) SYRINGE
PREFILLED_SYRINGE | INTRAVENOUS | Status: DC | PRN
  Administered 2019-04-29: 50 mg via INTRAVENOUS
  Administered 2019-04-29: 20 mg via INTRAVENOUS

## 2019-04-29 MED ORDER — LIDOCAINE 2% (20 MG/ML) 5 ML SYRINGE
INTRAMUSCULAR | Status: DC | PRN
  Administered 2019-04-29: 50 mg via INTRAVENOUS

## 2019-04-29 MED ORDER — ONDANSETRON HCL 4 MG/2ML IJ SOLN
INTRAMUSCULAR | Status: DC | PRN
  Administered 2019-04-29: 4 mg via INTRAVENOUS

## 2019-04-29 MED ORDER — ATORVASTATIN CALCIUM 40 MG PO TABS
40.0000 mg | ORAL_TABLET | Freq: Every day | ORAL | Status: DC
  Administered 2019-04-29 – 2019-04-30 (×2): 40 mg via ORAL
  Filled 2019-04-29 (×2): qty 1

## 2019-04-29 MED ORDER — EPINEPHRINE PF 1 MG/ML IJ SOLN
INTRAMUSCULAR | Status: AC
  Filled 2019-04-29: qty 1

## 2019-04-29 MED ORDER — ONDANSETRON HCL 4 MG/2ML IJ SOLN
INTRAMUSCULAR | Status: AC
  Filled 2019-04-29: qty 2

## 2019-04-29 MED ORDER — MIDAZOLAM HCL 5 MG/5ML IJ SOLN
INTRAMUSCULAR | Status: DC | PRN
  Administered 2019-04-29: 2 mg via INTRAVENOUS

## 2019-04-29 MED ORDER — FENTANYL CITRATE (PF) 100 MCG/2ML IJ SOLN
INTRAMUSCULAR | Status: DC | PRN
  Administered 2019-04-29 (×3): 50 ug via INTRAVENOUS

## 2019-04-29 MED ORDER — ONDANSETRON HCL 4 MG PO TABS: 4.0000 mg | ORAL_TABLET | Freq: Three times a day (TID) | ORAL | 0 refills | Status: DC | PRN

## 2019-04-29 MED ORDER — LACTATED RINGERS IV SOLN: INTRAVENOUS | Status: DC | PRN

## 2019-04-29 MED ORDER — 0.9 % SODIUM CHLORIDE (POUR BTL) OPTIME
TOPICAL | Status: DC | PRN
  Administered 2019-04-29: 1000 mL

## 2019-04-29 MED ORDER — ROCURONIUM BROMIDE 10 MG/ML (PF) SYRINGE
PREFILLED_SYRINGE | INTRAVENOUS | Status: AC
  Filled 2019-04-29: qty 10

## 2019-04-29 MED ORDER — SODIUM CHLORIDE 0.9% FLUSH: 3.0000 mL | INTRAVENOUS | Status: DC | PRN

## 2019-04-29 MED ORDER — HYDROMORPHONE HCL 1 MG/ML IJ SOLN
0.2500 mg | INTRAMUSCULAR | Status: DC | PRN
  Administered 2019-04-29: 0.25 mg via INTRAVENOUS
  Administered 2019-04-29: 0.5 mg via INTRAVENOUS
  Administered 2019-04-29: 0.25 mg via INTRAVENOUS
  Administered 2019-04-29: 13:00:00 0.5 mg via INTRAVENOUS

## 2019-04-29 MED ORDER — FENTANYL CITRATE (PF) 250 MCG/5ML IJ SOLN
INTRAMUSCULAR | Status: AC
  Filled 2019-04-29: qty 5

## 2019-04-29 MED ORDER — MORPHINE SULFATE (PF) 2 MG/ML IV SOLN: 2.0000 mg | INTRAVENOUS | Status: AC | PRN

## 2019-04-29 MED ORDER — PROPOFOL 10 MG/ML IV BOLUS
INTRAVENOUS | Status: DC | PRN
  Administered 2019-04-29: 100 mg via INTRAVENOUS

## 2019-04-29 MED ORDER — ACETAMINOPHEN 500 MG PO TABS
1000.0000 mg | ORAL_TABLET | Freq: Once | ORAL | Status: AC
  Administered 2019-04-29: 1000 mg via ORAL
  Filled 2019-04-29: qty 2

## 2019-04-29 MED ORDER — DEXAMETHASONE SODIUM PHOSPHATE 10 MG/ML IJ SOLN
INTRAMUSCULAR | Status: AC
  Filled 2019-04-29: qty 1

## 2019-04-29 MED ORDER — ALUM & MAG HYDROXIDE-SIMETH 200-200-20 MG/5ML PO SUSP
30.0000 mL | ORAL | Status: DC | PRN
  Administered 2019-04-29: 21:00:00 30 mL via ORAL
  Filled 2019-04-29 (×2): qty 30

## 2019-04-29 MED ORDER — HEPARIN SODIUM (PORCINE) 1000 UNIT/ML IJ SOLN
INTRAMUSCULAR | Status: AC
  Filled 2019-04-29: qty 1

## 2019-04-29 SURGICAL SUPPLY — 109 items
APPLIER CLIP 11 MED OPEN (CLIP) ×9
BLADE CLIPPER SURG (BLADE) IMPLANT
BLADE SURG 10 STRL SS (BLADE) ×3 IMPLANT
BUR EGG ELITE 4.0 (BURR) ×2 IMPLANT
BUR EGG ELITE 4.0MM (BURR) ×1
CABLE BIPOLOR RESECTION CORD (MISCELLANEOUS) ×3 IMPLANT
CATH FOLEY 2WAY SLVR  5CC 16FR (CATHETERS) ×2
CATH FOLEY 2WAY SLVR 5CC 16FR (CATHETERS) ×1 IMPLANT
CLIP APPLIE 11 MED OPEN (CLIP) ×2 IMPLANT
CLIP LIGATING EXTRA MED SLVR (CLIP) ×3 IMPLANT
CLIP LIGATING EXTRA SM BLUE (MISCELLANEOUS) ×3 IMPLANT
CLIP NEUROVISION LG (CLIP) ×2 IMPLANT
CLOSURE STERI-STRIP 1/2X4 (GAUZE/BANDAGES/DRESSINGS) ×2
CLSR STERI-STRIP ANTIMIC 1/2X4 (GAUZE/BANDAGES/DRESSINGS) ×2 IMPLANT
CNTNR URN SCR LID CUP LEK RST (MISCELLANEOUS) IMPLANT
CONT SPEC 4OZ STRL OR WHT (MISCELLANEOUS) ×2
COVER SURGICAL LIGHT HANDLE (MISCELLANEOUS) ×3 IMPLANT
COVER WAND RF STERILE (DRAPES) ×6 IMPLANT
DERMABOND ADVANCED (GAUZE/BANDAGES/DRESSINGS) ×2
DERMABOND ADVANCED .7 DNX12 (GAUZE/BANDAGES/DRESSINGS) ×1 IMPLANT
DISSECTOR BLUNT TIP ENDO 5MM (MISCELLANEOUS) ×6 IMPLANT
DRAPE C-ARM 42X72 X-RAY (DRAPES) ×6 IMPLANT
DRAPE C-ARMOR (DRAPES) ×3 IMPLANT
DRAPE POUCH INSTRU U-SHP 10X18 (DRAPES) ×3 IMPLANT
DRAPE SURG 17X23 STRL (DRAPES) ×3 IMPLANT
DRAPE U-SHAPE 47X51 STRL (DRAPES) ×3 IMPLANT
DRSG OPSITE POSTOP 4X6 (GAUZE/BANDAGES/DRESSINGS) ×4 IMPLANT
DRSG OPSITE POSTOP 4X8 (GAUZE/BANDAGES/DRESSINGS) ×3 IMPLANT
DURAPREP 26ML APPLICATOR (WOUND CARE) ×3 IMPLANT
ELECT BLADE 4.0 EZ CLEAN MEGAD (MISCELLANEOUS) ×6
ELECT CAUTERY BLADE 6.4 (BLADE) ×3 IMPLANT
ELECT PENCIL ROCKER SW 15FT (MISCELLANEOUS) ×3 IMPLANT
ELECT REM PT RETURN 9FT ADLT (ELECTROSURGICAL) ×3
ELECTRODE BLDE 4.0 EZ CLN MEGD (MISCELLANEOUS) ×2 IMPLANT
ELECTRODE REM PT RTRN 9FT ADLT (ELECTROSURGICAL) ×1 IMPLANT
GAUZE 4X4 16PLY RFD (DISPOSABLE) IMPLANT
GLOVE BIO SURGEON STRL SZ 6.5 (GLOVE) ×2 IMPLANT
GLOVE BIO SURGEON STRL SZ7.5 (GLOVE) IMPLANT
GLOVE BIO SURGEONS STRL SZ 6.5 (GLOVE) ×1
GLOVE BIOGEL PI IND STRL 6.5 (GLOVE) ×1 IMPLANT
GLOVE BIOGEL PI IND STRL 8.5 (GLOVE) ×2 IMPLANT
GLOVE BIOGEL PI INDICATOR 6.5 (GLOVE) ×2
GLOVE BIOGEL PI INDICATOR 8.5 (GLOVE) ×4
GLOVE SS BIOGEL STRL SZ 7.5 (GLOVE) ×1 IMPLANT
GLOVE SS BIOGEL STRL SZ 8.5 (GLOVE) ×2 IMPLANT
GLOVE SUPERSENSE BIOGEL SZ 7.5 (GLOVE) ×2
GLOVE SUPERSENSE BIOGEL SZ 8.5 (GLOVE) ×4
GOWN STRL REUS W/ TWL LRG LVL3 (GOWN DISPOSABLE) ×2 IMPLANT
GOWN STRL REUS W/TWL 2XL LVL3 (GOWN DISPOSABLE) ×9 IMPLANT
GOWN STRL REUS W/TWL LRG LVL3 (GOWN DISPOSABLE) ×4
GUIDEWIRE NITINOL BEVEL TIP (WIRE) ×8 IMPLANT
HEMOSTAT SNOW SURGICEL 2X4 (HEMOSTASIS) IMPLANT
HEMOSTAT SURGICEL 2X14 (HEMOSTASIS) ×2 IMPLANT
INSERT FOGARTY 61MM (MISCELLANEOUS) IMPLANT
INSERT FOGARTY SM (MISCELLANEOUS) IMPLANT
KIT BASIN OR (CUSTOM PROCEDURE TRAY) ×3 IMPLANT
KIT BONE MRW ASP ANGEL CPRP (KITS) ×2 IMPLANT
KIT TURNOVER KIT B (KITS) ×3 IMPLANT
LIGHT SOURCE STRAIGHT TIP (INSTRUMENTS) ×4 IMPLANT
LOOP VESSEL MAXI BLUE (MISCELLANEOUS) IMPLANT
LOOP VESSEL MINI RED (MISCELLANEOUS) IMPLANT
MODULE EMG NDL SSEP NVM5 (NEEDLE) IMPLANT
MODULE EMG NEEDLE SSEP NVM5 (NEEDLE) ×3 IMPLANT
MODULE NVM5 NEXT GEN EMG (NEEDLE) ×2 IMPLANT
NDL SPNL 18GX3.5 QUINCKE PK (NEEDLE) ×1 IMPLANT
NEEDLE SPNL 18GX3.5 QUINCKE PK (NEEDLE) ×6 IMPLANT
NS IRRIG 1000ML POUR BTL (IV SOLUTION) ×3 IMPLANT
PACK LAMINECTOMY ORTHO (CUSTOM PROCEDURE TRAY) ×3 IMPLANT
PACK UNIVERSAL I (CUSTOM PROCEDURE TRAY) ×3 IMPLANT
PAD ARMBOARD 7.5X6 YLW CONV (MISCELLANEOUS) ×12 IMPLANT
PROBE BALL TIP NVM5 SNG USE (BALLOONS) ×2 IMPLANT
PUTTY DBM ALLOSYNC PURE 10CC (Putty) ×4 IMPLANT
ROD RELINE MAS LORD 5.5X45MM (Rod) ×4 IMPLANT
SCREW LOCK RELINE 5.5 TULIP (Screw) ×8 IMPLANT
SCREW RELINE RED 6.5X45MM POLY (Screw) ×8 IMPLANT
SPACER OLIF 12X55 6D (Spacer) ×2 IMPLANT
SPONGE INTESTINAL PEANUT (DISPOSABLE) ×9 IMPLANT
SPONGE LAP 18X18 RF (DISPOSABLE) IMPLANT
SPONGE LAP 4X18 RFD (DISPOSABLE) IMPLANT
SPONGE SURGIFOAM ABS GEL 100 (HEMOSTASIS) ×2 IMPLANT
STAPLER VISISTAT 35W (STAPLE) IMPLANT
SUCTION FRAZIER HANDLE 10FR (MISCELLANEOUS) ×2
SUCTION TUBE FRAZIER 10FR DISP (MISCELLANEOUS) IMPLANT
SURGIFLO W/THROMBIN 8M KIT (HEMOSTASIS) ×7 IMPLANT
SUT BONE WAX W31G (SUTURE) ×3 IMPLANT
SUT MON AB 3-0 SH 27 (SUTURE) ×4
SUT MON AB 3-0 SH27 (SUTURE) ×1 IMPLANT
SUT PDS AB 1 CTX 36 (SUTURE) ×3 IMPLANT
SUT PROLENE 4 0 RB 1 (SUTURE)
SUT PROLENE 4-0 RB1 .5 CRCL 36 (SUTURE) IMPLANT
SUT PROLENE 5 0 C 1 24 (SUTURE) IMPLANT
SUT PROLENE 5 0 CC1 (SUTURE) IMPLANT
SUT PROLENE 6 0 C 1 30 (SUTURE) ×3 IMPLANT
SUT PROLENE 6 0 CC (SUTURE) IMPLANT
SUT SILK 0 TIES 10X30 (SUTURE) ×3 IMPLANT
SUT SILK 2 0 TIES 10X30 (SUTURE) ×6 IMPLANT
SUT SILK 2 0SH CR/8 30 (SUTURE) IMPLANT
SUT SILK 3 0 TIES 10X30 (SUTURE) ×6 IMPLANT
SUT SILK 3 0SH CR/8 30 (SUTURE) IMPLANT
SUT VIC AB 1 CT1 27 (SUTURE) ×4
SUT VIC AB 1 CT1 27XBRD ANBCTR (SUTURE) ×2 IMPLANT
SUT VIC AB 2-0 CT1 18 (SUTURE) ×3 IMPLANT
SYR BULB IRRIGATION 50ML (SYRINGE) ×3 IMPLANT
TOWEL GREEN STERILE (TOWEL DISPOSABLE) ×6 IMPLANT
TOWEL GREEN STERILE FF (TOWEL DISPOSABLE) ×3 IMPLANT
TRAP SPECIMEN MUCOUS 40CC (MISCELLANEOUS) ×3 IMPLANT
TUBE CONNECTING 20'X1/4 (TUBING) ×1
TUBE CONNECTING 20X1/4 (TUBING) ×1 IMPLANT
WATER STERILE IRR 1000ML POUR (IV SOLUTION) ×3 IMPLANT

## 2019-04-29 NOTE — Anesthesia Procedure Notes (Addendum)
Anesthesia Regional Block: TAP block   Pre-Anesthetic Checklist: ,, timeout performed, Correct Patient, Correct Site, Correct Laterality, Correct Procedure, Correct Position, site marked, Risks and benefits discussed,  Surgical consent,  Pre-op evaluation,  At surgeon's request and post-op pain management  Laterality: Left  Prep: Maximum Sterile Barrier Precautions used, chloraprep       Needles:  Injection technique: Single-shot  Needle Type: Echogenic Stimulator Needle     Needle Length: 9cm  Needle Gauge: 22     Additional Needles:   Procedures:,,,, ultrasound used (permanent image in chart),,,,  Narrative:  Start time: 04/29/2019 7:10 AM End time: 04/29/2019 7:16 AM Injection made incrementally with aspirations every 5 mL.  Performed by: Personally  Anesthesiologist: Lannie Fields, DO  Additional Notes: Monitors applied. No increased pain on injection. No increased resistance to injection. Injection made in 5cc increments. Good needle visualization. Patient tolerated procedure well.

## 2019-04-29 NOTE — Brief Op Note (Signed)
04/29/2019  12:00 PM  PATIENT:  Isaac Hicks  74 y.o. 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  PROCEDURE:  Procedure(s) with comments: Obliquie lumbar internal fusion L4-5 with posterior spinal fusion interbody (N/A) - 4.5 hrs Left tap block with exparel Early to do approach ABDOMINAL EXPOSURE (N/A)  SURGEON:  Surgeon(s) and Role: Panel 1:    Venita Lick, MD - Primary Panel 2:    * Early, Kristen Loader, MD - Primary  PHYSICIAN ASSISTANT:   ASSISTANTS: Amanda Ward, PA   ANESTHESIA:   general  EBL:  100 mL   BLOOD ADMINISTERED:none  DRAINS: none   LOCAL MEDICATIONS USED:  MARCAINE     SPECIMEN:  No Specimen  DISPOSITION OF SPECIMEN:  N/A  COUNTS:  YES  TOURNIQUET:  * No tourniquets in log *  DICTATION: .Dragon Dictation  PLAN OF CARE: Admit to inpatient   PATIENT DISPOSITION:  PACU - hemodynamically stable.

## 2019-04-29 NOTE — Op Note (Signed)
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  Posterior segmental instrumentation with pedicle screw fixation L4-5  Approach surgeon: Dr. Gretta Began  First Assistant: Glynis Smiles, PA  Complications: None  EBL: 100 cc  Cell Saver: None  Neuro monitoring: No abnormal free running EMG/SSEP monitoring activity during the case.  All 4 pedicle screws were directly stimulated and there was no adverse activity greater than 40 mA.  Implants: Medtronic pivot peek intervertebral cage.  12 x 55.      NuVasive MIS pedicle screws.  6.5 x 45 mm length x4.  Allograft: Allosync with BMA  Indications: Isaac Hicks is a very pleasant 74 year old gentleman with progressive back buttock and radicular left leg pain as well as a chronic left foot drop.  Attempts at conservative management had failed to alleviate his symptoms or improve his quality of life.  As a result we elected to move forward with surgery.  All appropriate risks benefits and alternatives were discussed with the patient and consent was obtained.  Operative note.  Patient is brought the operating room placed upon the operating room table.  After successful induction of general anesthesia and endotracheal ovation teds SCDs and a Foley were inserted.  The neuro monitoring representative then inserted all appropriate needles and pads for monitoring and the patient was turned into the lateral decubitus position left side up.  Axillary roll was placed and all bony prominences were well-padded.  Once the patient was properly positioned in the lateral decubitus position confirming with x-rays that we had anatomical positioning of the L4-5 disc space I then secured the patient with tape to the table.  With the patient secured we then draped out the proposed incision site.  At this point  time the patient was prepped and draped in a standard fashion and a timeout was conducted.  We confirm patient procedure and all other important data.  At this point time Dr. Arbie Hicks and Isaac Folks Ward, PA performed a standard oblique lumbar retroperitoneal approach to the L4-5 disc space level.  Once he had properly exposed the L4-5 disc space and the retractors were in place we placed the needle into the L4-5 disc space and took an x-ray to confirm that I was at the appropriate level.  At this point time Dr. Arbie Hicks scrubbed out and we proceeded with the remainder of the case.  Please refer to his dictation for specifics on the approach.  I was able to palpate the left iliac wing and through the same incision and advanced the Jamshidi needle into the iliac wing.  I then aspirated approximately 30 to 40 cc of blood to mixed with the allograft.  Once I had aspirated the appropriate amount of bone marrow I then placed a thrombin-soaked Gelfoam over the hole that I created with the Jamshidi needle.  An annulotomy was performed with a 10 blade scalpel and then I used pituitary rongeurs to remove the bulk of the disc material.  I then placed the elevator and began malleting across the disc space.  Using the lateral image I confirmed that the osteotome remained in the intervertebral space.  Once I was across the disc space down to the contralateral annulus I advanced through this to release it.  Once I released the contralateral annulus I then used the box osteotomes to remove the bulk of the disc material.  I again used both the  lateral and AP images to monitor my trajectory and position of the instrument.  I then used curettes and Kerrison rongeurs to remove the remainder of the disc space.  At this point I could visualize the bleeding subchondral bone of L4 and L5 confirming an adequate discectomy and removal of the cartilaginous endplate.  I then used my trial spacers and elected to move forward with a 12 x 55 length  peek intervertebral cage.  This provided excellent overall distraction of the disc space improvement in the spondylolisthesis and improvement in the foraminal volume.  The trial device was removed and I rasped the endplates and then irrigated the wound copiously with normal saline.  Intervertebral cages obtained and packed with the allograft.  The cage was then advanced across the disc space using lateral fluoroscopy.  I confirmed that the posterior margin of the cage was still within the intervertebral space.  Once I felt I was nearing the contralateral annulus I confirmed on the AP view that I had slightly countersink the cage and it was properly seated.  I remove the inserting handle and took final x-rays.  I was pleased with the location of the cage.  It provided reduction of the spondylolisthesis, improved foraminal volume and increased the intervertebral disc space height when compared to the preoperative x-rays.  I then packed additional bone graft anterior and over the cage.  I then irrigated again and confirmed that hemostasis using bipolar electrocautery and FloSeal.  The retractors were then sequentially removed and there was no bleeding noted.  At this point time the fascia of the external oblique/rectus was reapproximated with a running #1 PDS suture.  The remainder of the wound was closed in a layered fashion with interrupted #1 Vicryl suture, 2-0 Vicryl suture, and a 3-0 Monocryl.  Steri-Strips and a dry dressing were applied.  With the intervertebral cage properly seated I then moved forward with the posterior pedicle screw supplementation.  Identified the lateral border of the L4 and L5 pedicles and marked skin surface.  I then infiltrated the area with quarter percent Marcaine with epinephrine and made a small incision I advanced the Jamshidi needle down to the lateral aspect of the L4 pedicle.  Again using fluoroscopy identified in the AP view that I was on the lateral wall of the L4 pedicle.   I connected the Jamshidi needle to the neuro monitoring device and using fluoroscopic guidance and real-time neuro monitoring I advanced the Jamshidi needle into the L4 pedicle.  I confirmed trajectory and positioning using fluoroscopy.  I advanced the Jamshidi needle down towards the medial wall of the pedicle as seen on the AP view.  As I near the medial wall I went to the lateral view.  He was here I confirmed that the Jamshidi needle was just beyond the posterior wall the vertebral body confirming appropriate trajectory.  I then advanced the Jamshidi needle into the vertebral body and then placed the guidepin to cannulate the pedicle.  Using this exact same technique I cannulated the L5 pedicle and the contralateral L4 and L5 pedicles.  Once I had cannulated all 4 pedicles and I was satisfied with the AP and lateral images I then measured and elected to use the 6.5 x 45 mm length screws at both levels.  The screws were obtained and then advanced over the guidepin.  As the pedicle screw crossed the posterior vertebral body I remove the inserting guidepin to prevent it from migrating anteriorly.  All 4 pedicle screws  were properly positioned.  There was excellent purchase.  X-rays at this point were satisfactory the 4 screws were properly positioned there were no issues with the intraoperative fluoroscopy views.  I then directly stimulated all 4 pedicle screws and there is no adverse activity at greater than 40 mA.  I then measured and placed the 45 mm length rod through the L4 and L5 screws.  I confirmed I was in the proper position and then placed the locking nuts.  I then removed the inserting device for the rod and then placed the final locking nut.  Once all 4 locking nuts were properly placed I confirmed again that they have been torqued and tightened according manufacture standards.  The inserting MIS blades were then broken off and removed.  At this point final AP and lateral x-rays were taken confirming  that the pedicle screws were properly positioned and the intervertebral cage remained stable and in proper position.  The 4 posterior incisions were irrigated copiously normal saline and hemostasis was obtained using bipolar cautery and FloSeal.  These 4 wounds were then closed in layered fashion with interrupted #1 Vicryl suture, 2-0 Vicryl suture, and 3-0 Monocryl.  Steri-Strips and dry dressings were applied and the patient was ultimately extubated transfer the PACU without incident.  The end of the case all needle sponge counts were correct.

## 2019-04-29 NOTE — Discharge Instructions (Signed)
°Spinal Fusion, Adult, Care After °This sheet gives you information about how to care for yourself after your procedure. Your doctor may also give you more specific instructions. If you have problems or questions, contact your doctor. °Follow these instructions at home: °Medicines °· Take over-the-counter and prescription medicines only as told by your doctor. These include any medicines for pain or blood-thinning medicines (anticoagulants). °· If you were prescribed an antibiotic medicine, take it as told by your doctor. Do not stop taking the antibiotic even if you start to feel better. °· Do not drive for 24 hours if you were given a medicine to help you relax (sedative) during your procedure. °· Do not drive or use heavy machinery while taking prescription pain medicine. °If you have a brace: °· Wear the brace as told by your doctor. Take it off only as told by your doctor. °· Keep the brace clean. °Managing pain, stiffness, and swelling °· If directed, put ice on the surgery area: °? If you have a removable brace, take it off as told by your doctor. °? Put ice in a plastic bag. °? Place a towel between your skin and the bag. °? Leave the ice on for 20 minutes, 2-3 times a day. °Surgery cut care ° °  °· Follow instructions from your doctor about how to take care of your cut from surgery (incision). Make sure you: °? Wash your hands with soap and water before you change your bandage (dressing). If you cannot use soap and water, use hand sanitizer. °? Change your bandage as told by your doctor. °? Leave stitches (sutures), skin glue, or skin tape (adhesive) strips in place. They may need to stay in place for 2 weeks or longer. If tape strips get loose and curl up, you may trim the loose edges. Do not remove tape strips completely unless your doctor says it is okay. °· Keep your cut from surgery clean and dry. °? Do not take baths, swim, or use a hot tub until your doctor says it is okay. °? Ask your doctor if you  can take showers. You may only be allowed to take sponge baths. °· Every day, check your cut from surgery and the area around it for: °? More redness, swelling, or pain. °? Fluid or blood. °? Warmth. °? Pus or a bad smell. °· If you have a drain tube, follow instructions from your doctor about caring for it. Do not take out the drain tube or any bandages unless your doctor says it is okay. °Physical activity °· Rest and protect your back as much as possible. °· Follow instructions from your doctor about how to move. Use good posture to help your spine heal. °· Do not lift anything that is heavier than 8 lb (3.6 kg), or the limit that you are told, until your doctor says that it is safe. °· Do not twist or bend at the waist until your doctor says it is okay. °· It is best if you: °? Do not make pushing and pulling motions. °? Do not sit or lie down in the same position for a long time. °? Do not raise your hands or arms above your head. °· Return to your normal activities as told by your doctor. Ask your doctor what activities are safe for you. Rest and protect your back as much as you can. °· Do not start to exercise until your doctor says it is okay. Ask your doctor what kinds of exercise you   can do to make your back stronger. °· Ok to shower in 5 days.  Do not take a bath or submerge the wound °General instructions °· To prevent blood clots and lessen swelling in your legs: °? Wear compression stockings as told. °? Walk one or more times every few hours as told by your doctor. °· Do not use any products that contain nicotine or tobacco, such as cigarettes and e-cigarettes. These can delay bone healing. If you need help quitting, ask your doctor. °· To prevent or treat constipation while you are taking prescription pain medicine, your doctor may suggest that you: °? Drink enough fluid to keep your pee (urine) pale yellow. °? Take over-the-counter or prescription medicines. °? Eat foods that are high in fiber. These  include fresh fruits and vegetables, whole grains, and beans. °? Limit foods that are high in fat and processed sugars, such as fried and sweet foods. °· Keep all follow-up visits as told by your doctor. This is important. °Contact a doctor if: °· Your pain gets worse. °· Your medicine does not help your pain. °· Your legs or feet get painful or swollen. °· Your cut from surgery is more red, swollen, or painful. °· Your cut from surgery feels warm to the touch. °· You have: °? Fluid or blood coming from your cut from surgery. °? Pus or a bad smell coming from your cut from surgery. °? A fever. °? Weakness or loss of feeling (numbness) in your legs that is new or getting worse. °? Trouble controlling when you pee (urinate) or poop (have a bowel movement). °· You feel sick to your stomach (nauseous). °· You throw up (vomit). °Get help right away if: °· Your pain is very bad. °· You have chest pain. °· You have trouble breathing. °· You start to have a cough. °These symptoms may be an emergency. Do not wait to see if the symptoms will go away. Get medical help right away. Call your local emergency services (911 in the [place] Do not drive yourself to the hospital. °Summary °· After the procedure, it is common to have pain in your back and pain by your surgery cut(s). °· Icing and pain medicines may help to control the pain. Follow directions from your doctor. °· Rest and protect your back as much as possible. Do not twist or bend at the waist. °· Get up and walk one or more times every few hours as told by your doctor. °This information is not intended to replace advice given to you by your health care provider. Make sure you discuss any questions you have with your health care provider. ° °Enoxaparin injection °What is this medicine? °ENOXAPARIN (ee nox a PA rin) is used after knee, hip, or abdominal surgeries to prevent blood clotting. It is also used to treat existing blood clots in the lungs or in the veins. °This  medicine may be used for other purposes; ask your health care provider or pharmacist if you have questions. °COMMON BRAND NAME(S): Lovenox °What should I tell my health care provider before I take this medicine? °They need to know if you have any of these conditions: °-bleeding disorders, hemorrhage, or hemophilia °-infection of the heart or heart valves °-kidney or liver disease °-previous stroke °-prosthetic heart valve °-recent surgery or delivery of a baby °-ulcer in the stomach or intestine, diverticulitis, or other bowel disease °-an unusual or allergic reaction to enoxaparin, heparin, pork or pork products, other medicines, foods, dyes, or preservatives °-  pregnant or trying to get pregnant °-breast-feeding °How should I use this medicine? °This medicine is for injection under the skin. It is usually given by a health-care professional. You or a family member may be trained on how to give the injections. If you are to give yourself injections, make sure you understand how to use the syringe, measure the dose if necessary, and give the injection. To avoid bruising, do not rub the site where this medicine has been injected. Do not take your medicine more often than directed. Do not stop taking except on the advice of your doctor or health care professional. °Make sure you receive a puncture-resistant container to dispose of the needles and syringes once you have finished with them. Do not reuse these items. Return the container to your doctor or health care professional for proper disposal. °Talk to your pediatrician regarding the use of this medicine in children. Special care may be needed. °Overdosage: If you think you have taken too much of this medicine contact a poison control center or emergency room at once. °NOTE: This medicine is only for you. Do not share this medicine with others. °What if I miss a dose? °If you miss a dose, take it as soon as you can. If it is almost time for your next dose, take  only that dose. Do not take double or extra doses. °What may interact with this medicine? °-aspirin and aspirin-like medicines °-certain medicines that treat or prevent blood clots °-dipyridamole °-NSAIDs, medicines for pain and inflammation, like ibuprofen or naproxen °This list may not describe all possible interactions. Give your health care provider a list of all the medicines, herbs, non-prescription drugs, or dietary supplements you use. Also tell them if you smoke, drink alcohol, or use illegal drugs. Some items may interact with your medicine. °What should I watch for while using this medicine? °Visit your healthcare professional for regular checks on your progress. You may need blood work done while you are taking this medicine. Your condition will be monitored carefully while you are receiving this medicine. It is important not to miss any appointments. °If you are going to need surgery or other procedure, tell your healthcare professional that you are using this medicine. °Using this medicine for a long time may weaken your bones and increase the risk of bone fractures. °Avoid sports and activities that might cause injury while you are using this medicine. Severe falls or injuries can cause unseen bleeding. Be careful when using sharp tools or knives. Consider using an electric razor. Take special care brushing or flossing your teeth. Report any injuries, bruising, or red spots on the skin to your healthcare professional. °Wear a medical ID bracelet or chain. Carry a card that describes your disease and details of your medicine and dosage times. °What side effects may I notice from receiving this medicine? °Side effects that you should report to your doctor or health care professional as soon as possible: °-allergic reactions like skin rash, itching or hives, swelling of the face, lips, or tongue °-bone pain °-signs and symptoms of bleeding such as bloody or black, tarry stools; red or dark-brown urine;  spitting up blood or brown material that looks like coffee grounds; red spots on the skin; unusual bruising or bleeding from the eye, gums, or nose °-signs and symptoms of a blood clot such as chest pain; shortness of breath; pain, swelling, or warmth in the leg °-signs and symptoms of a stroke such as changes in vision; confusion; trouble   speaking or understanding; severe headaches; sudden numbness or weakness of the face, arm or leg; trouble walking; dizziness; loss of coordination °Side effects that usually do not require medical attention (report to your doctor or health care professional if they continue or are bothersome): °-hair loss °-pain, redness, or irritation at site where injected °This list may not describe all possible side effects. Call your doctor for medical advice about side effects. You may report side effects to FDA at 1-800-FDA-1088. °Where should I keep my medicine? °Keep out of the reach of children. °Store at room temperature between 15 and 30 degrees C (59 and 86 degrees F). Do not freeze. If your injections have been specially prepared, you may need to store them in the refrigerator. Ask your pharmacist. Throw away any unused medicine after the expiration date. °NOTE: This sheet is a summary. It may not cover all possible information. If you have questions about this medicine, talk to your doctor, pharmacist, or health care provider. °

## 2019-04-29 NOTE — Anesthesia Procedure Notes (Signed)
Procedure Name: Intubation Date/Time: 04/29/2019 7:37 AM Performed by: Moshe Salisbury, CRNA Pre-anesthesia Checklist: Patient identified, Emergency Drugs available, Suction available and Patient being monitored Patient Re-evaluated:Patient Re-evaluated prior to induction Oxygen Delivery Method: Circle System Utilized Preoxygenation: Pre-oxygenation with 100% oxygen Induction Type: IV induction Ventilation: Mask ventilation without difficulty Laryngoscope Size: Mac and 4 Grade View: Grade I Tube type: Oral Tube size: 8.0 mm Number of attempts: 1 Airway Equipment and Method: Stylet Placement Confirmation: ETT inserted through vocal cords under direct vision,  positive ETCO2 and breath sounds checked- equal and bilateral Secured at: 23 cm Tube secured with: Tape Dental Injury: Teeth and Oropharynx as per pre-operative assessment

## 2019-04-29 NOTE — Transfer of Care (Signed)
Immediate Anesthesia Transfer of Care Note  Patient: Isaac Hicks  Procedure(s) Performed: Obliquie lumbar internal fusion L4-5 with posterior spinal fusion interbody (N/A ) ABDOMINAL EXPOSURE (N/A )  Patient Location: PACU  Anesthesia Type:GA combined with regional for post-op pain  Level of Consciousness: drowsy and patient cooperative  Airway & Oxygen Therapy: Patient Spontanous Breathing and Patient connected to nasal cannula oxygen  Post-op Assessment: Report given to RN, Post -op Vital signs reviewed and stable and Patient moving all extremities  Post vital signs: Reviewed and stable  Last Vitals:  Vitals Value Taken Time  BP    Temp    Pulse    Resp    SpO2      Last Pain:  Vitals:   04/29/19 0619  TempSrc:   PainSc: 0-No pain      Patients Stated Pain Goal: 3 (04/29/19 5834)  Complications: No apparent anesthesia complications

## 2019-04-29 NOTE — H&P (Signed)
Addendum H&P: There has been no change in the patient's clinical exam since his last office visit of 04/19/2019.  He continues to have severe back buttock and radicular left leg pain.  Patient has a chronic left foot drop.  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  Although he does have degenerative disease at L3-4 and foraminal stenosis is primarily on the right side and he has no right radicular leg pain at this point time I do believe his primary pain generators the L4-5 degenerative spondylolisthesis with left foraminal stenosis and lateral recess disease.  After discussing treatment options he elected to move forward with surgery.  Plan on an oblique lumbar interbody fusion at L4-5 with supplemental posterior pedicle screw fixation.  I have gone over the risks benefits and alternatives to surgery with the patient and all of his questions were encouraged and addressed.

## 2019-04-29 NOTE — Op Note (Signed)
    OPERATIVE REPORT  DATE OF SURGERY: 04/29/2019  PATIENT: Isaac Hicks, 74 y.o. male MRN: [id number]  DOB: [birth year]  PRE-OPERATIVE DIAGNOSIS: Degenerative disc disease  POST-OPERATIVE DIAGNOSIS:  Same  PROCEDURE: Oblique exposure for L4-5 interbody fusion with Dr. Shon Baton  SURGEON:  Gretta Began, M.D.  Co-surgeon for the exposure Dr. Venita Lick   ANESTHESIA: General  EBL: per anesthesia record  Total I/O In: 1000 [I.V.:1000] Out: 250 [Urine:175; Blood:75]  BLOOD ADMINISTERED: none  DRAINS: none  SPECIMEN: none  COUNTS CORRECT:  YES  PATIENT DISPOSITION:  PACU - hemodynamically stable  PROCEDURE DETAILS: The patient was taken the operating placed supine position general tracheal anesthesia was administered.  The patient was then placed with lateral position with right hip down left hip up.  He was positioned on the bed and C arm was used to confirm appropriate positioning as confirmed with Dr. Shon Baton.  The level of the L4-5 disc was marked on the surface of the skin.  This was just 2 fingerbreadths anterior to the anterior superior iliac spine.  Vision was made over this area and carried down through the anterior sheath.  The oblique muscles were spread with Metzenbaum sutures were not divided.  The retroperitoneal space was entered bluntly and blunt dissection was continued down to the level of the psoas muscle.  Blunt dissection over the psoas muscle was continued to the medial portion and the spine was identified.  The iliac artery and veins were moved to the right.  The psoas muscle was mobilized posteriorly.  A spinal needle was positioned in the disc that was visualized at the base of the incision and this was identified as the 5 1 level.  Dissection was continued further superiorly then in the next disc space was exposed.  The Thompson retractor was used for exposure.  A spinal needle was positioned in the 4 5 disc and this was confirmed with fluoroscopy.  The  remainder the procedure will be dictated as a separate note by Dr. Shon Baton.   Larina Earthly, M.D., [facility] 04/29/2019 10:41 AM

## 2019-04-29 NOTE — Anesthesia Postprocedure Evaluation (Signed)
Anesthesia Post Note  Patient: Isaac Hicks  Procedure(s) Performed: Obliquie lumbar internal fusion L4-5 with posterior spinal fusion interbody (N/A ) ABDOMINAL EXPOSURE (N/A )     Patient location during evaluation: PACU Anesthesia Type: Regional and General Level of consciousness: awake and alert, oriented and patient cooperative Pain management: pain level controlled Vital Signs Assessment: post-procedure vital signs reviewed and stable Respiratory status: spontaneous breathing, nonlabored ventilation and respiratory function stable Cardiovascular status: blood pressure returned to baseline and stable Postop Assessment: no apparent nausea or vomiting Anesthetic complications: no    Last Vitals:  Vitals:   04/29/19 1228 04/29/19 1245  BP: 125/81 110/71  Pulse: 74 70  Resp: 12 18  Temp: 36.4 C   SpO2: 98% 99%    Last Pain:  Vitals:   04/29/19 0619  TempSrc:   PainSc: 0-No pain                 Lannie Fields

## 2019-04-30 ENCOUNTER — Encounter: Payer: Self-pay | Admitting: *Deleted

## 2019-04-30 MED ORDER — TAMSULOSIN HCL 0.4 MG PO CAPS
0.4000 mg | ORAL_CAPSULE | Freq: Every day | ORAL | Status: DC
  Administered 2019-04-30 – 2019-05-01 (×3): 0.4 mg via ORAL
  Filled 2019-04-30 (×3): qty 1

## 2019-04-30 NOTE — Evaluation (Signed)
Occupational Therapy Evaluation Patient Details Name: Isaac Hicks MRN: [id number] DOB: [birth year] Today's Date: 04/30/2019    History of Present Illness 74 y/o male s/p OLIF L4-5 with PSIF. PMH includes CVA, TIA, sciatica.   Clinical Impression   PTA pt living with spouse, independent for BADL/IADL. At time of eval, pt mostly limited by post sx pain and precautions. Pt able to complete bed mobility at mod I and transfers/functioanl mobility at supervision level of assist with RW. Back handout provided and reviewed adls in detail. Pt educated on: clothing between brace, never sleep in brace, avoid sitting for long periods of time, correct bed positioning for sleeping, correct sequence for bed mobility, avoiding lifting more than 5 pounds and never wash directly over incision. All education is complete and patient indicates understanding. Pt shows ability to complete LB dressing without assistance per simulation, able to don/doff socks. No further OT recommended at this time, OT Will sign off. Thank you for this consult.    Follow Up Recommendations  No OT follow up;Supervision - Intermittent    Equipment Recommendations  None recommended by OT    Recommendations for Other Services       Precautions / Restrictions Precautions Precautions: Fall;Back Precaution Booklet Issued: Yes (comment) Precaution Comments: precuations sheet issued and reviewed and practiced with BADL Required Braces or Orthoses: Spinal Brace Spinal Brace: Lumbar corset;Applied in sitting position Restrictions Weight Bearing Restrictions: No Other Position/Activity Restrictions: okay to remove for showering and sleeping; brace not present for session, pt has brace at home      Mobility Bed Mobility Overal bed mobility: Modified Independent             General bed mobility comments: demonstrates good log roll technique without cues or physical assist  Transfers Overall transfer level: Needs  assistance Equipment used: Rolling walker (2 wheeled) Transfers: Sit to/from Stand Sit to Stand: Supervision         General transfer comment: S to rise and steady, cues for hand placement    Balance Overall balance assessment: Needs assistance Sitting-balance support: Feet unsupported;No upper extremity supported Sitting balance-Leahy Scale: Good     Standing balance support: Bilateral upper extremity supported;During functional activity Standing balance-Leahy Scale: Poor Standing balance comment: reliant on external support                           ADL either performed or assessed with clinical judgement   ADL Overall ADL's : Needs assistance/impaired Eating/Feeding: Sitting;Independent   Grooming: Sitting;Independent   Upper Body Bathing: Independent;Sitting   Lower Body Bathing: Min guard;Sit to/from stand   Upper Body Dressing : Independent;Sitting   Lower Body Dressing: Min guard;Sit to/from stand   Toilet Transfer: Supervision/safety;Regular Toilet;Grab bars;RW Armed forces technical officer Details (indicate cue type and reason): reliant on grab bar to push; states he has cabinet nearby he can push from [place] and Hygiene: Set up;Sit to/from stand   Tub/ Shower Transfer: Supervision/safety;Shower seat;Ambulation   Functional mobility during ADLs: Supervision/safety;Rolling walker General ADL Comments: pt limited by post sx pain and precautions     Vision Baseline Vision/History: Wears glasses Wears Glasses: At all times Patient Visual Report: No change from baseline       Perception     Praxis      Pertinent Vitals/Pain Pain Assessment: No/denies pain     Hand Dominance     Extremity/Trunk Assessment Upper Extremity Assessment Upper Extremity Assessment: Overall WFL for tasks assessed  Lower Extremity Assessment Lower Extremity Assessment: Defer to PT evaluation       Communication Communication Communication: No  difficulties   Cognition Arousal/Alertness: Awake/alert Behavior During Therapy: WFL for tasks assessed/performed Overall Cognitive Status: Within Functional Limits for tasks assessed                                     General Comments       Exercises     Shoulder Instructions      Home Living Family/patient expects to be discharged to:: Private residence Living Arrangements: Spouse/significant other Available Help at Discharge: Family Type of Home: House Home Access: Stairs to enter [employer] of Steps: 2-3; 10 in back but does not use   Home Layout: One level     Bathroom Shower/Tub: Producer, television/film/video: Handicapped height     Home Equipment: Cane - single point;Walker - 2 wheels;Bedside commode   Additional Comments: equipment from parents whom passed and wife sxs      Prior Functioning/Environment Level of Independence: Independent with assistive device(s)        Comments: use of cane, chronic LLE foot drop        OT Problem List: Decreased knowledge of use of DME or AE;Decreased knowledge of precautions;Decreased activity tolerance;Impaired balance (sitting and/or standing);Pain      OT Treatment/Interventions: Self-care/ADL training;Therapeutic exercise;Patient/family education;Balance training;Therapeutic activities;DME and/or AE instruction    OT Goals(Current goals can be found in the care plan section) Acute Rehab OT Goals Patient Stated Goal: go home today OT Goal Formulation: With patient Time For Goal Achievement: 05/14/19 Potential to Achieve Goals: Good  OT Frequency: Min 2X/week   Barriers to D/C:            Co-evaluation              AM-PAC OT "6 Clicks" Daily Activity     Outcome Measure Help from another person eating meals?: None Help from another person taking care of personal grooming?: None Help from another person toileting, which includes using toliet, bedpan, or urinal?:  None Help from another person bathing (including washing, rinsing, drying)?: A Little Help from another person to put on and taking off regular upper body clothing?: None Help from another person to put on and taking off regular lower body clothing?: A Little 6 Click Score: 22   End of Session Equipment Utilized During Treatment: Gait belt;Rolling walker Nurse Communication: Mobility status;Precautions  Activity Tolerance: Patient tolerated treatment well Patient left: in bed;with call bell/phone within reach  OT Visit Diagnosis: Unsteadiness on feet (R26.81);Other abnormalities of gait and mobility (R26.89);Pain Pain - part of body: (back)                Time: 9480-1655 OT Time Calculation (min): 30 min Charges:  OT General Charges $OT Visit: 1 Visit OT Evaluation $OT Eval Low Complexity: 1 Low OT Treatments $Self Care/Home Management : 8-22 mins  Dalphine Handing, MSOT, OTR/L Acute Rehabilitation Services [facility] Office Number: [phone]  Dalphine Handing 04/30/2019, 10:10 AM

## 2019-04-30 NOTE — Evaluation (Signed)
Physical Therapy Evaluation Patient Details Name: Isaac Hicks MRN: [id number] DOB: March 03, 1946 Today's Date: 04/30/2019   History of Present Illness  74 y/o male s/p OLIF L4-5 with PSIF. PMH includes CVA, TIA, sciatica.  Clinical Impression  Pt admitted with above diagnosis. At the time of PT eval, pt was able to demonstrate transfers and ambulation with gross min guard assist to supervision for safety and RW for support. Pt with L foot drop but managed well without AFO donned (AFO and back brace not present in the hospital). Pt was educated on precautions, brace application/wearing schedule, appropriate activity progression, and car transfer. Pt currently with functional limitations due to the deficits listed below (see PT Problem List). Pt will benefit from skilled PT to increase their independence and safety with mobility to allow discharge to the venue listed below.     Follow Up Recommendations No PT follow up;Supervision for mobility/OOB    Equipment Recommendations  None recommended by PT    Recommendations for Other Services       Precautions / Restrictions Precautions Precautions: Fall;Back Precaution Booklet Issued: Yes (comment) Precaution Comments: Reviewed handout and pt was cued for precautions during functional mobility.  Required Braces or Orthoses: Spinal Brace Spinal Brace: Lumbar corset;Applied in sitting position Restrictions Weight Bearing Restrictions: No Other Position/Activity Restrictions: okay to remove for showering and sleeping; brace not present for session, pt has brace at home      Mobility  Bed Mobility Overal bed mobility: Modified Independent             General bed mobility comments: demonstrates good log roll technique without cues or physical assist  Transfers Overall transfer level: Needs assistance Equipment used: Rolling walker (2 wheeled) Transfers: Sit to/from Stand Sit to Stand: Supervision         General transfer  comment: S to rise and steady, cues for hand placement. Unsteady initially with sit back down onto bed but able to stand after and gain/maintain balance without assistance.   Ambulation/Gait Ambulation/Gait assistance: Supervision Gait Distance (Feet): 250 Feet Assistive device: Rolling walker (2 wheeled) Gait Pattern/deviations: Step-through pattern;Decreased stride length;Trunk flexed Gait velocity: Decreased Gait velocity interpretation: <1.8 ft/sec, indicate of risk for recurrent falls General Gait Details: VC's for improved posture and general safety with the RW. Pt able to make corrective changes to posture but unable to maintain.   Stairs Stairs: Yes Stairs assistance: Min guard;Supervision Stair Management: Two rails;Step to pattern;Forwards Number of Stairs: 5 General stair comments: VC's for sequencing and general safety. No assist required but close supervision with occasional hands-on guarding provided for safety,   Wheelchair Mobility    Modified Rankin (Stroke Patients Only)       Balance Overall balance assessment: Needs assistance Sitting-balance support: Feet unsupported;No upper extremity supported Sitting balance-Leahy Scale: Good     Standing balance support: Bilateral upper extremity supported;During functional activity Standing balance-Leahy Scale: Poor Standing balance comment: reliant on external support                             Pertinent Vitals/Pain Pain Assessment: No/denies pain    Home Living Family/patient expects to be discharged to:: Private residence Living Arrangements: Spouse/significant other Available Help at Discharge: Family Type of Home: House Home Access: Stairs to enter   [employer] of Steps: 2-3; 10 in back but does not use Home Layout: One level Home Equipment: Cane - single point;Walker - 2 wheels;Bedside commode Additional Comments: equipment  from parents whom passed and wife sxs    Prior  Function Level of Independence: Independent with assistive device(s)         Comments: use of cane, chronic LLE foot drop     Hand Dominance        Extremity/Trunk Assessment   Upper Extremity Assessment Upper Extremity Assessment: Defer to OT evaluation    Lower Extremity Assessment Lower Extremity Assessment: LLE deficits/detail LLE Deficits / Details: Chronic drop foot with AFO       Communication   Communication: No difficulties  Cognition Arousal/Alertness: Awake/alert Behavior During Therapy: WFL for tasks assessed/performed Overall Cognitive Status: Within Functional Limits for tasks assessed                                        General Comments      Exercises     Assessment/Plan    PT Assessment Patient needs continued PT services  PT Problem List Decreased strength;Decreased balance;Decreased activity tolerance;Decreased mobility;Decreased knowledge of use of DME;Decreased safety awareness;Decreased knowledge of precautions;Pain       PT Treatment Interventions DME instruction;Gait training;Functional mobility training;Therapeutic exercise;Stair training;Therapeutic activities;Neuromuscular re-education;Patient/family education    PT Goals (Current goals can be found in the Care Plan section)  Acute Rehab PT Goals Patient Stated Goal: go home today PT Goal Formulation: With patient Time For Goal Achievement: 05/07/19 Potential to Achieve Goals: Good    Frequency Min 5X/week   Barriers to discharge        Co-evaluation               AM-PAC PT "6 Clicks" Mobility  Outcome Measure Help needed turning from your back to your side while in a flat bed without using bedrails?: None Help needed moving from lying on your back to sitting on the side of a flat bed without using bedrails?: None Help needed moving to and from a bed to a chair (including a wheelchair)?: A Little Help needed standing up from a chair using your arms  (e.g., wheelchair or bedside chair)?: A Little Help needed to walk in hospital room?: A Little Help needed climbing 3-5 steps with a railing? : A Little 6 Click Score: 20    End of Session Equipment Utilized During Treatment: Gait belt Activity Tolerance: Patient tolerated treatment well Patient left: in chair;with call bell/phone within reach Nurse Communication: Mobility status PT Visit Diagnosis: Unsteadiness on feet (R26.81);Pain Pain - part of body: (incision sites)    Time: 6270-3500 PT Time Calculation (min) (ACUTE ONLY): 21 min   Charges:   PT Evaluation $PT Eval Moderate Complexity: 1 Mod          Rolinda Roan, PT, DPT Acute Rehabilitation Services Pager: [phone] Office: [phone]   Thelma Comp 04/30/2019, 2:30 PM

## 2019-04-30 NOTE — Progress Notes (Signed)
Subjective: 1 Day Post-Op Procedure(s) (LRB): Obliquie lumbar internal fusion L4-5 with posterior spinal fusion interbody (N/A) ABDOMINAL EXPOSURE (N/A) Patient reports pain as mild.  Minimal incisional site pain. Complains of left thigh pain s/p approach.  +ambulation Tolerating Po with one episode of vomiting, resolved.  +void +flatus X1 Denies CP, SOB, dizziness, lightheadedness, calf pain, sweats/chills.  Objective: Vital signs in last 24 hours: Temp:  [97.5 F (36.4 C)-98.1 F (36.7 C)] 98.1 F (36.7 C) (02/12 0818) Pulse Rate:  [68-84] 74 (02/12 0818) Resp:  [9-20] 16 (02/12 0818) BP: (101-136)/(69-89) 130/72 (02/12 0818) SpO2:  [93 %-100 %] 93 % (02/12 0818)  Intake/Output from previous day: 02/11 0701 - 02/12 0700 In: 2940 [P.O.:240; I.V.:1600; IV Piggyback:1100] Out: 275 [Urine:175; Blood:100] Intake/Output this shift: Total I/O In: 480 [P.O.:480] Out: -   No results for input(s): HGB in the last 72 hours. No results for input(s): WBC, RBC, HCT, PLT in the last 72 hours. No results for input(s): NA, K, CL, CO2, BUN, CREATININE, GLUCOSE, CALCIUM in the last 72 hours. No results for input(s): LABPT, INR in the last 72 hours.  Neurologically intact ABD soft Neurovascular intact Sensation intact distally Intact pulses distally Incision: scant drainage No cellulitis present Compartment soft Left foot drop at baseline. Right foot plantarflexion and dorsiflexion in tact.   Assessment/Plan: 1 Day Post-Op Procedure(s) (LRB): Obliquie lumbar internal fusion L4-5 with posterior spinal fusion interbody (N/A) ABDOMINAL EXPOSURE (N/A) Advance diet Up with therapy Encourage IS DVT PPx: Teds, SCDs, ambulation  Plan D/C tomorrow.   F/u with Dr. Shon Baton 2 weeks post-op.   Isaac Hicks Isaac Hicks 04/30/2019, 11:17 AM

## 2019-04-30 NOTE — Progress Notes (Signed)
Patient ID: Isaac Hicks, male   DOB: 05/19/1945, 74 y.o.   MRN: [id number] Comfortable.  Sitting up in chair.  Reports mild back pain.  Minimal abdominal soreness.  Had nausea this morning but ate a very large breakfast following this nausea event.  Abdomen soft.  Will not follow actively.  Plans for discharge tomorrow noted.  Please call if we can assist

## 2019-05-01 MED ORDER — ONDANSETRON HCL 4 MG PO TABS: 4.0000 mg | ORAL_TABLET | Freq: Three times a day (TID) | ORAL | 0 refills | Status: AC | PRN

## 2019-05-01 MED ORDER — OXYCODONE-ACETAMINOPHEN 10-325 MG PO TABS: 1.0000 | ORAL_TABLET | Freq: Four times a day (QID) | ORAL | 0 refills | Status: AC | PRN

## 2019-05-01 MED ORDER — METHOCARBAMOL 500 MG PO TABS: 500.0000 mg | ORAL_TABLET | Freq: Three times a day (TID) | ORAL | 0 refills | Status: AC | PRN

## 2019-05-01 NOTE — Discharge Summary (Signed)
Physician Discharge Summary   Patient ID: Isaac Hicks MRN: [id number] DOB/AGE: 1946-01-17 74 y.o.  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 Back pain   . BPH (benign prostatic hyperplasia)   . Bradycardia   . Cerebrovascular accident (CVA) (HCC)    2000  . GERD (gastroesophageal reflux disease)   . Hypercholesteremia   . Isthmic spondylolisthesis   . PTSD (post-traumatic stress disorder)   . Sciatica    Discharge Diagnoses:   Active Problems:   S/P lumbar fusion  Procedure:  Procedure(s) (LRB): [place] lumbar internal fusion L4-5 with posterior spinal fusion interbody (N/A) ABDOMINAL EXPOSURE (N/A)   Consults: None  HPI:  see H&P    Laboratory Data: Hospital Outpatient Visit on 04/26/2019  Component Date Value Ref Range Status  . SARS Coronavirus 2 04/26/2019 NEGATIVE  NEGATIVE Final   Comment: (NOTE) SARS-CoV-2 target nucleic acids are NOT DETECTED. The SARS-CoV-2 RNA is generally detectable in upper and lower respiratory specimens during the acute phase of infection. Negative results do not preclude SARS-CoV-2 infection, do not rule out co-infections with other pathogens, and should not be used as the sole basis for treatment or other patient management decisions. Negative results must be combined with clinical observations, patient history, and epidemiological information. The expected result is Negative. Fact Sheet for Patients: HairSlick.no Fact Sheet for Healthcare Providers: quierodirigir.com This test is not yet approved or cleared by the [place] FDA and  has been authorized for detection and/or diagnosis of SARS-CoV-2 by FDA under an Emergency Use Authorization (EUA). This EUA will remain  in effect (meaning this test can be  used) for the duration of the COVID-19 declaration under Section 56                          4(b)(1) of the Act, 21 U.S.C. section 360bbb-3(b)(1), unless the authorization is terminated or revoked sooner. Performed at [facility], [address], [place], [place] [address]    No results for input(s): HGB in the last 72 hours. No results for input(s): WBC, RBC, HCT, PLT in the last 72 hours. No results for input(s): NA, K, CL, CO2, BUN, CREATININE, GLUCOSE, CALCIUM in the last 72 hours. No results for input(s): LABPT, INR in the last 72 hours.  X-Rays:DG Chest 2 View  Result Date: 04/26/2019 CLINICAL DATA:  Preop for lumbar fusion. EXAM: CHEST - 2 VIEW COMPARISON:  None. FINDINGS: The heart size and mediastinal contours are within normal limits. Both lungs are clear. The visualized skeletal structures are unremarkable. IMPRESSION: No active cardiopulmonary disease. Electronically Signed   By: Lupita Raider M.D.   On: 04/26/2019 15:30   DG Lumbar Spine 2-3 Views  Result Date: 04/29/2019 CLINICAL DATA:  L4-5 OLIF EXAM: LUMBAR SPINE - 2-3 VIEW; DG C-ARM 1-60 MIN COMPARISON:  10/28/16 FINDINGS: Two images obtained via portable C-arm radiography obtained in the operating room show the interval placement of bilateral pedicle screws and posterior rods at L4-5. Interbody spacer is identified within the L4-5 disc space. IMPRESSION: Status post posterior hardware fixation and interbody fusion at L4-5. Electronically Signed   By: Signa Kell M.D.   On: 04/29/2019 12:04   DG C-Arm 1-60 Min  Result Date: 04/29/2019 CLINICAL DATA:  L4-5 OLIF EXAM: LUMBAR SPINE - 2-3 VIEW; DG C-ARM 1-60 MIN COMPARISON:  10/28/16 FINDINGS: Two images obtained via portable C-arm radiography obtained in the operating room show the interval placement of bilateral pedicle screws and posterior rods at L4-5. Interbody spacer is identified within the L4-5 disc space. IMPRESSION: Status post posterior hardware fixation and  interbody fusion at L4-5. Electronically Signed   By: Kerby Moors M.D.   On: 04/29/2019 12:04    EKG: Orders placed or performed in visit on 04/26/19  . EKG 12-Lead  . EKG 12-Lead  . EKG 12-Lead     Hospital Course: Patient was admitted to [facility] and taken to the OR and underwent the above state procedure without complications.  Patient tolerated the procedure well and was later transferred to the recovery room and then to the orthopaedic floor for postoperative care.  They were given PO and IV analgesics for pain control following their surgery.  They were given 24 hours of postoperative antibiotics.   PT was consulted postop to assist with mobility and transfers.  The patient was allowed to be WBAT with therapy and was taught back precautions. Discharge planning was consulted to help with postop disposition and equipment needs.  Patient had a good night on the evening of surgery and started to get up OOB with therapy on day one. Patient was seen in rounds and was ready to go home on day two.  They were given discharge instructions and dressing directions.  They were instructed on when to follow up in the office with Dr. Rolena Infante.   Diet: Regular diet Activity:WBAT, Lspine precautions, brace Follow-up:in 14 days Disposition - Home Discharged Condition: good   Discharge Instructions    Call MD / Call 911   Complete by: As directed    If you experience chest pain or shortness of breath, CALL 911 and be transported to the hospital emergency room.  If you develope a fever above 101 F, pus (white drainage) or increased drainage or redness at the wound, or calf pain, call your surgeon's office.   Constipation Prevention   Complete by: As directed    Drink plenty of fluids.  Prune juice may be helpful.  You may use a stool softener, such as Colace (over the counter) 100 mg twice a day.  Use MiraLax (over the counter) for constipation as needed.   Diet - low sodium heart healthy    Complete by: As directed    Incentive spirometry RT   Complete by: As directed    Increase activity slowly as tolerated   Complete by: As directed      Allergies as of 05/01/2019   No Known Allergies     Medication List    STOP taking these medications   aspirin 81 MG EC tablet   KRILL OIL PO   multivitamin with minerals Tabs tablet   saw palmetto 160 MG capsule     TAKE these medications   atorvastatin 40 MG tablet Commonly known as: LIPITOR Take 1 tablet (40 mg total) by mouth daily at 6 PM.   gabapentin 300 MG capsule Commonly known as: NEURONTIN Take 300 mg by mouth at bedtime.   methocarbamol 500 MG tablet Commonly known as: Robaxin Take 1 tablet (500 mg total) by mouth every 8 (eight) hours as needed for up to 5 days for muscle spasms.   ondansetron 4 MG tablet Commonly known as: Zofran Take 1 tablet (4 mg total) by mouth every 8 (eight) hours as needed for nausea or vomiting.   oxyCODONE-acetaminophen 10-325 MG tablet Commonly  known as: Percocet Take 1 tablet by mouth every 6 (six) hours as needed for up to 5 days for pain.      Follow-up Information    Venita Lick, MD. Schedule an appointment as soon as possible for a visit in 2 weeks.   Specialty: Orthopedic Surgery Why: If symptoms worsen, For suture removal, For wound re-check Contact information: [address] [place] [address] [phone]           Signed: Andrez Grime, PA-C Orthopaedic Surgery 05/01/2019, 8:25 AM

## 2019-05-01 NOTE — Progress Notes (Signed)
Patient is discharged from room 3C11 at this time. Alert and in stable condition. IV site d/c'd and instructions read to patient and son with understanding verbalized. Left unit via wheelchair with all belongings at side. 

## 2019-05-01 NOTE — Progress Notes (Signed)
Subjective: 2 Days Post-Op Procedure(s) (LRB): [place] lumbar internal fusion L4-5 with posterior spinal fusion interbody (N/A) ABDOMINAL EXPOSURE (N/A) Patient reports pain as mild.  No radicular symptoms. Nausea resolved Positive flatus no BM yet Feels ready to go home  Objective: Vital signs in last 24 hours: Temp:  [98.4 F (36.9 C)-99.4 F (37.4 C)] 98.4 F (36.9 C) (02/13 0739) Pulse Rate:  [71-85] 85 (02/13 0739) Resp:  [16-20] 16 (02/13 0739) BP: (100-118)/(58-95) 118/95 (02/13 0739) SpO2:  [95 %-100 %] 99 % (02/13 0739)  Intake/Output from previous day: 02/12 0701 - 02/13 0700 In: 480 [P.O.:480] Out: -  Intake/Output this shift: No intake/output data recorded.  No results for input(s): HGB in the last 72 hours. No results for input(s): WBC, RBC, HCT, PLT in the last 72 hours. No results for input(s): NA, K, CL, CO2, BUN, CREATININE, GLUCOSE, CALCIUM in the last 72 hours. No results for input(s): LABPT, INR in the last 72 hours.  Neurologically intact ABD soft Neurovascular intact Sensation intact distally Intact pulses distally Dorsiflexion/Plantar flexion intact Incision: dressing C/D/I and no drainage No cellulitis present Compartment soft Stable L foot drop Sensation intact Dressing clean and dry  Assessment/Plan: 2 Days Post-Op Procedure(s) (LRB): [place]liqu[place] lumbar internal fusion L4-5 with posterior spinal fusion interbody (N/A) ABDOMINAL EXPOSURE (N/A) Advance diet Up with therapy D/C IV fluids Plan D/C today   Isaac Hicks 05/01/2019, 8:22 AM

## 2019-05-03 MED FILL — Sodium Chloride IV Soln 0.9%: INTRAVENOUS | Qty: 1000 | Status: AC

## 2019-05-03 MED FILL — Heparin Sodium (Porcine) Inj 1000 Unit/ML: INTRAMUSCULAR | Qty: 30 | Status: AC

## 2019-07-24 ENCOUNTER — Encounter (HOSPITAL_BASED_OUTPATIENT_CLINIC_OR_DEPARTMENT_OTHER): Payer: Self-pay

## 2019-07-24 ENCOUNTER — Other Ambulatory Visit: Payer: Self-pay

## 2019-07-24 ENCOUNTER — Emergency Department (HOSPITAL_BASED_OUTPATIENT_CLINIC_OR_DEPARTMENT_OTHER): Payer: Medicare Other

## 2019-07-24 ENCOUNTER — Emergency Department (HOSPITAL_BASED_OUTPATIENT_CLINIC_OR_DEPARTMENT_OTHER)
Admission: EM | Admit: 2019-07-24 | Discharge: 2019-07-24 | Disposition: A | Discharge status: Home / Self Care | Payer: Medicare Other | Attending: Emergency Medicine | Admitting: Emergency Medicine

## 2019-07-24 DIAGNOSIS — S62630B Displaced fracture of distal phalanx of right index finger, initial encounter for open fracture: Secondary | ICD-10-CM | POA: Insufficient documentation

## 2019-07-24 DIAGNOSIS — Y93H9 Activity, other involving exterior property and land maintenance, building and construction: Secondary | ICD-10-CM | POA: Diagnosis not present

## 2019-07-24 DIAGNOSIS — Y999 Unspecified external cause status: Secondary | ICD-10-CM | POA: Diagnosis not present

## 2019-07-24 DIAGNOSIS — Z23 Encounter for immunization: Secondary | ICD-10-CM | POA: Diagnosis not present

## 2019-07-24 DIAGNOSIS — W28XXXA Contact with powered lawn mower, initial encounter: Secondary | ICD-10-CM | POA: Diagnosis not present

## 2019-07-24 DIAGNOSIS — Z87891 Personal history of nicotine dependence: Secondary | ICD-10-CM | POA: Insufficient documentation

## 2019-07-24 DIAGNOSIS — Y9289 Other specified places as the place of occurrence of the external cause: Secondary | ICD-10-CM | POA: Insufficient documentation

## 2019-07-24 DIAGNOSIS — S61319A Laceration without foreign body of unspecified finger with damage to nail, initial encounter: Secondary | ICD-10-CM

## 2019-07-24 DIAGNOSIS — Z79899 Other long term (current) drug therapy: Secondary | ICD-10-CM | POA: Diagnosis not present

## 2019-07-24 DIAGNOSIS — S62639B Displaced fracture of distal phalanx of unspecified finger, initial encounter for open fracture: Secondary | ICD-10-CM

## 2019-07-24 DIAGNOSIS — S61310A Laceration without foreign body of right index finger with damage to nail, initial encounter: Secondary | ICD-10-CM | POA: Insufficient documentation

## 2019-07-24 DIAGNOSIS — S6991XA Unspecified injury of right wrist, hand and finger(s), initial encounter: Secondary | ICD-10-CM | POA: Diagnosis present

## 2019-07-24 MED ORDER — LIDOCAINE HCL (PF) 1 % IJ SOLN
10.0000 mL | Freq: Once | INTRAMUSCULAR | Status: AC
  Administered 2019-07-24: 5 mL
  Filled 2019-07-24: qty 10

## 2019-07-24 MED ORDER — CEPHALEXIN 250 MG PO CAPS
500.0000 mg | ORAL_CAPSULE | Freq: Once | ORAL | Status: AC
  Administered 2019-07-24: 21:00:00 500 mg via ORAL
  Filled 2019-07-24: qty 2

## 2019-07-24 MED ORDER — CEPHALEXIN 500 MG PO CAPS: 500.0000 mg | ORAL_CAPSULE | Freq: Four times a day (QID) | ORAL | 0 refills | Status: AC

## 2019-07-24 MED ORDER — TETANUS-DIPHTH-ACELL PERTUSSIS 5-2.5-18.5 LF-MCG/0.5 IM SUSP
0.5000 mL | Freq: Once | INTRAMUSCULAR | Status: AC
  Administered 2019-07-24: 0.5 mL via INTRAMUSCULAR
  Filled 2019-07-24: qty 0.5

## 2019-07-24 NOTE — ED Triage Notes (Signed)
Pt has partial amputation to distal tip of R index finger from a lawn mower blade.

## 2019-07-24 NOTE — Discharge Instructions (Signed)
Please read and follow all provided instructions.  Your diagnoses today include:  1. Open fracture of tuft of distal phalanx of finger   2. Laceration of right index finger without foreign body with damage to nail, initial encounter   3. Nailbed laceration, finger, initial encounter     Tests performed today include:  X-ray of the affected area that did not show any foreign bodies and did show broken fingertip bone  Vital signs. See below for your results today.   Medications prescribed:   Keflex (cephalexin) - antibiotic  You have been prescribed an antibiotic medicine: take the entire course of medicine even if you are feeling better. Stopping early can cause the antibiotic not to work.  Take any prescribed medications only as directed.   Home care instructions:  Follow any educational materials and wound care instructions contained in this packet.   Keep affected area above the level of your heart when possible to minimize swelling. Wash area gently twice a day with warm soapy water. Do not apply alcohol or hydrogen peroxide. Cover the area if it draining or weeping.   Follow-up instructions: I spoke with Dr. Amanda Pea on the telephone tonight.  He would like you to go to his office at 9 AM on Monday morning to be seen. to be seen.  Return instructions:  Return to the Emergency Department if you have:  Fever  Worsening pain  Worsening swelling of the wound  Pus draining from the wound  Redness of the skin that moves away from the wound, especially if it streaks away from the affected area   Any other emergent concerns  Your vital signs today were: BP (!) 162/83 (BP Location: Left Arm)   Pulse 69   Temp 98 F (36.7 C) (Oral)   Resp 20   Ht 5\' 10"  (1.778 m)   Wt 78.5 kg   SpO2 99%   BMI 24.82 kg/m  If your blood pressure (BP) was elevated above 135/85 this visit, please have this repeated by your doctor within one month. --------------

## 2019-07-24 NOTE — ED Notes (Signed)
Pt verified, name, dob. Josh EDP at bedside. Pt having sutures applied, unable to scan armband for abx medication

## 2019-07-24 NOTE — ED Notes (Signed)
ED Provider at bedside. 

## 2019-07-24 NOTE — ED Provider Notes (Addendum)
[facility] EMERGENCY DEPARTMENT Provider Note   CSN: [id number] Arrival date & time: 07/24/19  1845     History Chief Complaint  Patient presents with  . Finger Injury    Isaac Hicks is a 74 y.o. male.  Patient presents emergency department for laceration to the tip of the right index finger.  Patient states that he went to flick a speck of mud and the running blade cut the finger.  He has a very minimal injury to the nail of the long finger.  Unknown last tetanus.  Denies other injuries.  Injury occurred just prior to arrival.  No anticoagulation.        Past Medical History:  Diagnosis Date  . Back pain   . BPH (benign prostatic hyperplasia)   . Bradycardia   . Cerebrovascular accident (CVA) (HCC)    2000  . GERD (gastroesophageal reflux disease)   . Hypercholesteremia   . Isthmic spondylolisthesis   . PTSD (post-traumatic stress disorder)   . Sciatica     Patient Active Problem List   Diagnosis Date Noted  . S/P lumbar fusion 04/29/2019  . GERD (gastroesophageal reflux disease)   . Back pain   . TIA (transient ischemic attack) 03/25/2018    Past Surgical History:  Procedure Laterality Date  . ABDOMINAL EXPOSURE N/A 04/29/2019   Procedure: ABDOMINAL EXPOSURE;  Surgeon: Larina Earthly, MD;  Location: [facility] OR;  Service: Vascular;  Laterality: N/A;  . ANTERIOR LUMBAR FUSION N/A 04/29/2019   Procedure: Obliquie lumbar internal fusion L4-5 with posterior spinal fusion interbody;  Surgeon: Venita Lick, MD;  Location: [facility] OR;  Service: Orthopedics;  Laterality: N/A;  4.5 hrs Left tap block with exparel Early to do approach  . NO PAST SURGERIES         Family History  Problem Relation Age of Onset  . Heart disease Brother        CAD    Social History   Tobacco Use  . Smoking status: Former Games developer  . Smokeless tobacco: Never Used  Substance Use Topics  . Alcohol use: No  . Drug use: No    Home Medications Prior to Admission medications    Medication Sig Start Date End Date Taking? Authorizing Provider  atorvastatin (LIPITOR) 40 MG tablet Take 1 tablet (40 mg total) by mouth daily at 6 PM. 05/05/18   Ihor Austin, NP  gabapentin (NEURONTIN) 300 MG capsule Take 300 mg by mouth at bedtime.  04/02/18   [provider]  ondansetron (ZOFRAN) 4 MG tablet Take 1 tablet (4 mg total) by mouth every 8 (eight) hours as needed for nausea or vomiting. 05/01/19   Dorothy Spark, PA-C    Allergies    Patient has no known allergies.  Review of Systems   Review of Systems  Constitutional: Negative for activity change.  Musculoskeletal: Positive for arthralgias. Negative for joint swelling and neck pain.  Skin: Positive for wound.  Neurological: Negative for weakness and numbness.    Physical Exam Updated Vital Signs BP (!) 162/83 (BP Location: Left Arm)   Pulse 69   Temp 98 F (36.7 C) (Oral)   Resp 20   Ht 5\' 10"  (1.778 m)   Wt 78.5 kg   SpO2 99%   BMI 24.82 kg/m   Physical Exam Vitals and nursing note reviewed.  Constitutional:      Appearance: He is well-developed.  HENT:     Head: Normocephalic and atraumatic.  Eyes:  Conjunctiva/sclera: Conjunctivae normal.  Cardiovascular:     Pulses: Normal pulses. No decreased pulses.  Musculoskeletal:        General: Tenderness present.     Cervical back: Normal range of motion and neck supple.     Comments: Right index finger: There is a 2 cm flap laceration extending from the distal portion of the nailbed over the top of the finger and tracking transversely.  There is an underlying tuft fracture.  The distal half of the nail plate was removed from the nail matrix.  After further exploration, there was oozing of blood from a small 2 to 3 mm defect in the nailbed itself.  Right long finger: There is a small break in the nail plate without any obvious bleeding or additional trauma to the tip of the finger.  No laceration.  Normal distal sensation.  Skin:     General: Skin is warm and dry.  Neurological:     Mental Status: He is alert.     Sensory: No sensory deficit.     Comments: Motor, sensation, and vascular distal to the injury is fully intact.              ED Results / Procedures / Treatments   Labs (all labs ordered are listed, but only abnormal results are displayed) Labs Reviewed - No data to display  EKG None  Radiology DG Finger Index Right  Result Date: 07/24/2019 CLINICAL DATA:  Partial amputation EXAM: RIGHT INDEX FINGER 2+V COMPARISON:  None. FINDINGS: There is an acute, mildly displaced, comminuted fracture of the tuft of the distal phalanx of the second digit. There is surrounding soft tissue swelling with a soft tissue defect. There is no radiopaque foreign body. Advanced degenerative changes are noted at the first carpometacarpal joint. IMPRESSION: Acute, mildly displaced, comminuted fracture of the tuft of the distal phalanx of the second digit. There is surrounding soft tissue swelling with a soft tissue defect. Electronically Signed   By: Katherine Mantle M.D.   On: 07/24/2019 20:07    Procedures .Marland KitchenLaceration Repair  Date/Time: 07/24/2019 11:00 PM Performed by: Renne Crigler, PA-C Authorized by: Renne Crigler, PA-C   Consent:    Consent obtained:  Verbal   Consent given by:  Patient   Risks discussed:  Infection, pain, need for additional repair, poor cosmetic result and poor wound healing   Alternatives discussed:  Referral Anesthesia (see MAR for exact dosages):    Anesthesia method:  Nerve block   Block location:  Right index   Block needle gauge:  27 G   Block anesthetic:  Lidocaine 1% w/o epi   Block technique:  Single injection into the flexor tendon sheath   Block injection procedure:  Anatomic landmarks identified, introduced needle, incremental injection, anatomic landmarks palpated and negative aspiration for blood   Block outcome:  Anesthesia achieved Laceration details:    Location:   Finger   Finger location:  R index finger   Length (cm):  2 Repair type:    Repair type:  Simple Pre-procedure details:    Preparation:  Patient was prepped and draped in usual sterile fashion and imaging obtained to evaluate for foreign bodies Exploration:    Hemostasis achieved with:  Direct pressure and tourniquet   Wound exploration: wound explored through full range of motion and entire depth of wound probed and visualized     Wound extent: underlying fracture     Contaminated: no   Treatment:    Area cleansed with:  Betadine   Amount of cleaning:  Standard   Irrigation solution:  Sterile water   Irrigation volume:  1000cc   Irrigation method:  Pressure wash (using bottle cap nozzle)   Visualized foreign bodies/material removed: no   Skin repair:    Repair method:  Sutures   Suture size:  5-0   Suture material:  Nylon (Ethilon and plain gut)   Suture technique:  Simple interrupted   Number of sutures:  7 (#5 Ethilon, #2 plain gut to nailbed) Approximation:    Approximation:  Close Post-procedure details:    Dressing:  Bulky dressing, splint for protection and non-adherent dressing   Patient tolerance of procedure:  Tolerated well, no immediate complications   (including critical care time)  Medications Ordered in ED Medications  Tdap (BOOSTRIX) injection 0.5 mL (0.5 mLs Intramuscular Given 07/24/19 2009)  lidocaine (PF) (XYLOCAINE) 1 % injection 10 mL (5 mLs Infiltration Given 07/24/19 2008)  cephALEXin (KEFLEX) capsule 500 mg (500 mg Oral Given 07/24/19 2121)    ED Course  I have reviewed the triage vital signs and the nursing notes.  Pertinent labs & imaging results that were available during my care of the patient were reviewed by me and considered in my medical decision making (see chart for details).  Patient seen and examined. Work-up initiated. Will need x-ray. Patient soaking in iodine prior to my exam.  Vital signs reviewed and are as follows: BP (!) 162/83 (BP  Location: Left Arm)   Pulse 69   Temp 98 F (36.7 C) (Oral)   Resp 20   Ht 5\' 10"  (1.778 m)   Wt 78.5 kg   SpO2 99%   BMI 24.82 kg/m   X-ray reviewed. There is an underlying tuft fracture.   Digital block performed with adequate anesthesia.  Tourniquet placed.  Wound was irrigated with 1 L of sterile water under pressure.  Wound was then explored.  Pictures taken.  I discussed the injury with Dr. of hand surgery by telephone.  At that time I did not suspect significant nailbed injury.  Plan was to close skin defect, place patient on antibiotics, have him go to the clinic at 9 AM on Monday morning for follow-up and recheck.  Wound was repaired as above.  After additional exploration was performed a manipulation of the fingertip, there was oozing of blood from a small defect in the nailbed itself.  This was also closed.  There was a pressure bandage placed on the fingertip.  I evaluated this after it was placed to ensure that the dressing was not too tight.  No bleed thru visualized.  Patient was also placed in finger splint.  Home with Keflex every 6 hours.  Tetanus updated.  Patient states that he has routine labs to be drawn at 8:15 AM on Monday and will report to Dr. Tuesday office afterwards.  Patient counseled on wound care. Patient was urged to return to the Emergency Department urgently with worsening pain, swelling, expanding erythema especially if it streaks away from the affected area, fever, or if they have any other concerns. Patient verbalized understanding.     MDM Rules/Calculators/A&P                      Patient with open tuft fracture of the right index finger with small nailbed laceration.  Wound repair as above.  Orthopedic follow-up obtained.  Patient started on antibiotics.  Tetanus updated.   Final Clinical Impression(s) / ED Diagnoses Final  diagnoses:  Open fracture of tuft of distal phalanx of finger  Laceration of right index finger without foreign body  with damage to nail, initial encounter  Nailbed laceration, finger, initial encounter    Rx / DC Orders ED Discharge Orders         Ordered    cephALEXin (KEFLEX) 500 MG capsule  4 times daily     07/24/19 2140           Carlisle Cater, PA-C 07/24/19 2259    Carlisle Cater, PA-C 07/24/19 2303    Blanchie Dessert, MD 07/25/19 1529

## 2019-07-24 NOTE — ED Notes (Signed)
Per edp Josh, PA, apply pressure dressing and finger spliint

## 2019-07-24 NOTE — ED Notes (Signed)
Applied small pressure dressing to extremity affected. PA Josh observed bandages and static finger splint after completed and satisfied with the end result. Explained to pt that any other pains, numbness/ tingling or skin color changes to finger or any part of the hand, instructed to loosen and/or remove bandages and reapply. Explain process for re wrapping.  Pt. Understood directions.

## 2020-08-12 IMAGING — RF DG C-ARM 1-60 MIN
1 series · 2 of 2 positions shown · non-contrast
Comparison: 10/28/16

CLINICAL DATA: L4-5 TIGER

EXAM:
LUMBAR SPINE - 2-3 VIEW; DG C-ARM 1-60 MIN

[Series 1: run · 2 of 2 slices shown]
[im 1/2]
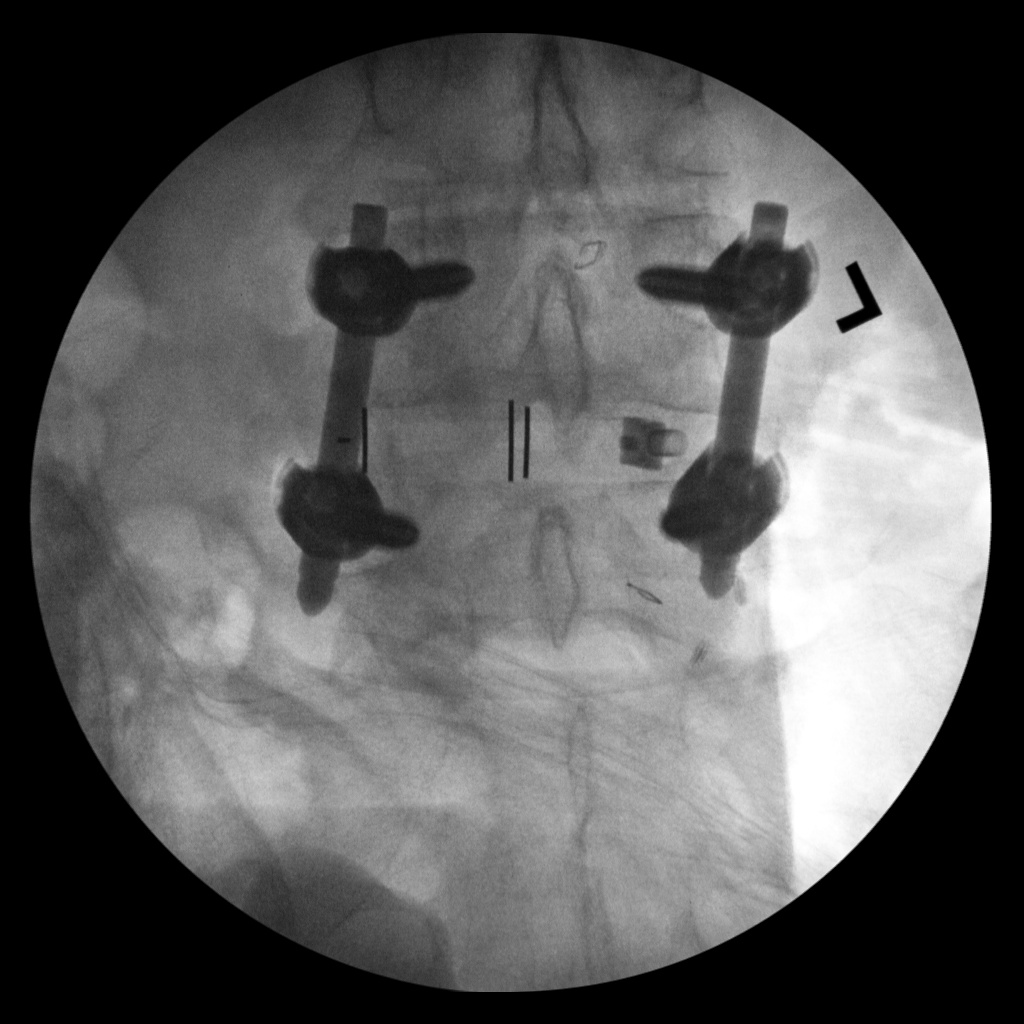
[im 2/2]
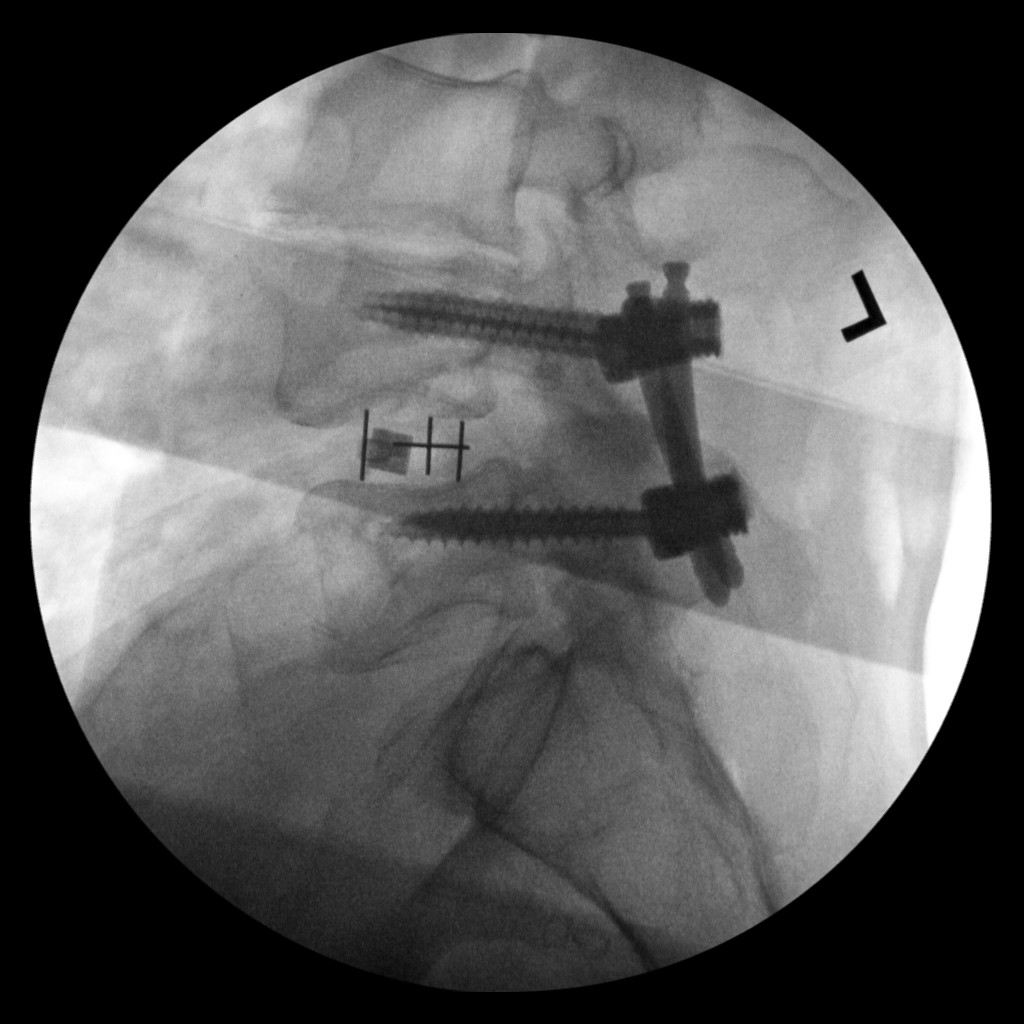

[2 of 2 positions shown; findings below may reference images not displayed]

FINDINGS: Two images obtained via portable C-arm radiography obtained in the
operating room show the interval placement of bilateral pedicle
screws and posterior rods at L4-5. Interbody spacer is identified
within the L4-5 disc space.
IMPRESSION: Status post posterior hardware fixation and interbody fusion at
L4-5.

## 2020-11-06 IMAGING — CR DG FINGER INDEX 2+V*R*
3 series · 3 of 3 positions shown · non-contrast
Comparison: None.

CLINICAL DATA: Partial amputation

EXAM:
RIGHT INDEX FINGER 2+V

[x finger pa right]
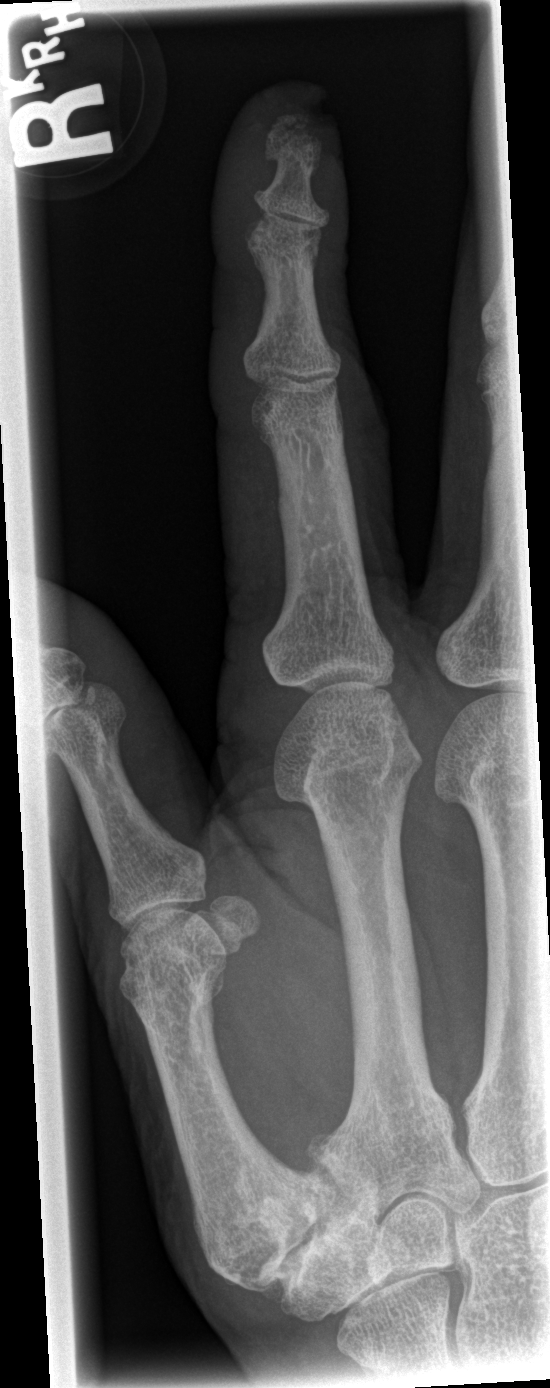

[x finger obl. right]
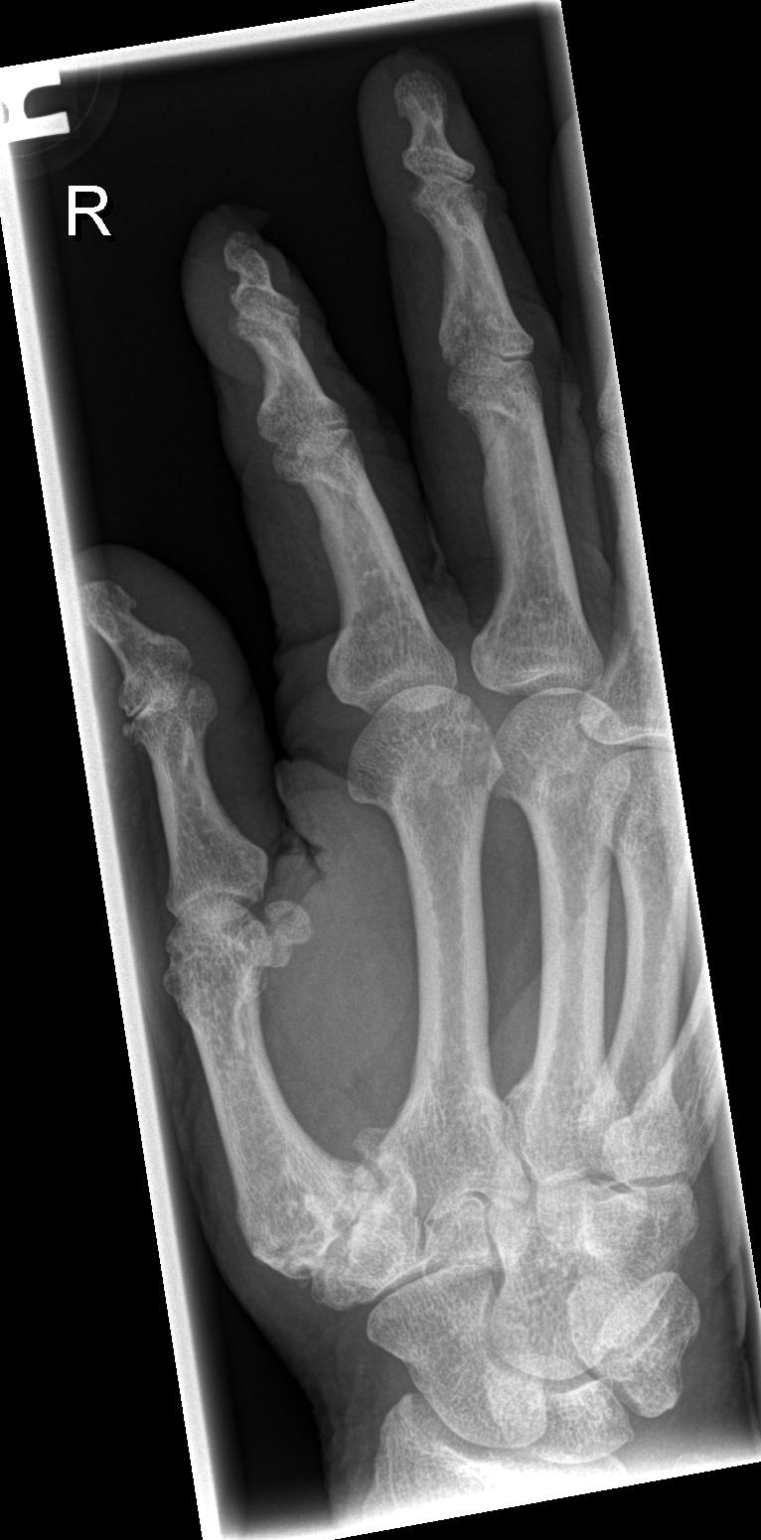

[x finger lateral right]
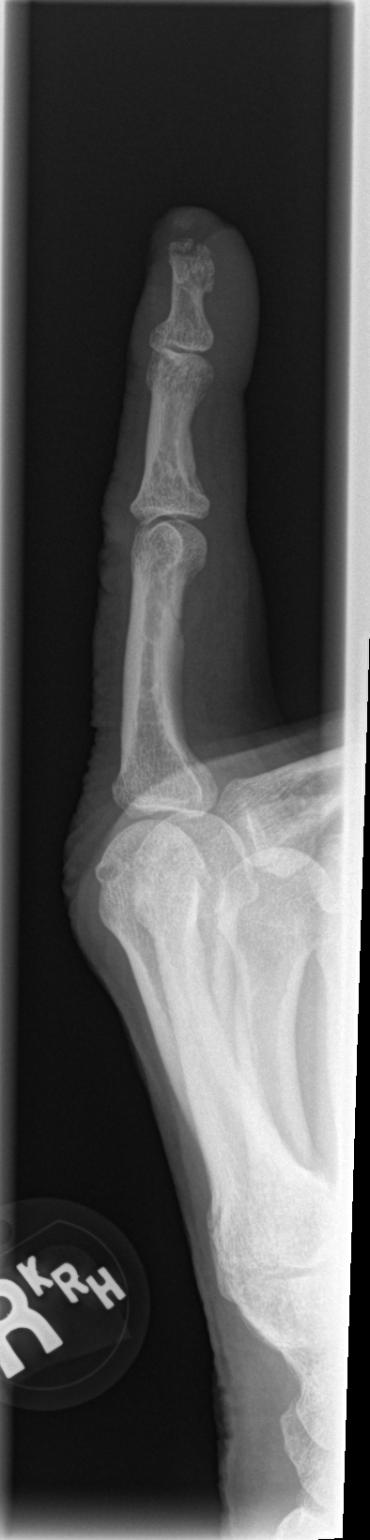

[3 of 3 positions shown; findings below may reference images not displayed]

FINDINGS: There is an acute, mildly displaced, comminuted fracture of the tuft
of the distal phalanx of the second digit. There is surrounding soft
tissue swelling with a soft tissue defect. There is no radiopaque
foreign body. Advanced degenerative changes are noted at the first
carpometacarpal joint.
IMPRESSION: Acute, mildly displaced, comminuted fracture of the tuft of the
distal phalanx of the second digit. There is surrounding soft tissue
swelling with a soft tissue defect.
